# Patient Record
Sex: Female | Born: 1980 | Race: White | Hispanic: No | Marital: Single | Smoking: Never smoker
Health system: Southern US, Community
[De-identification: ages and names within clinical notes are randomized; demographics above are authoritative.]

## PROBLEM LIST (undated history)

## (undated) DIAGNOSIS — L409 Psoriasis, unspecified: Secondary | ICD-10-CM

## (undated) DIAGNOSIS — G43909 Migraine, unspecified, not intractable, without status migrainosus: Secondary | ICD-10-CM

## (undated) HISTORY — DX: Migraine, unspecified, not intractable, without status migrainosus: G43.909

## (undated) HISTORY — DX: Psoriasis, unspecified: L40.9

---

## 2008-08-27 HISTORY — PX: CHOLECYSTECTOMY: SHX55

## 2009-06-28 ENCOUNTER — Emergency Department: Payer: Self-pay | Admitting: Emergency Medicine

## 2009-07-20 ENCOUNTER — Ambulatory Visit: Payer: Self-pay | Admitting: Family Medicine

## 2009-08-12 ENCOUNTER — Ambulatory Visit: Payer: Self-pay | Admitting: Surgery

## 2009-08-17 ENCOUNTER — Ambulatory Visit: Payer: Self-pay | Admitting: Surgery

## 2013-05-19 ENCOUNTER — Ambulatory Visit: Payer: Self-pay

## 2014-11-09 ENCOUNTER — Ambulatory Visit: Payer: Self-pay | Admitting: Family Medicine

## 2014-11-22 ENCOUNTER — Ambulatory Visit
Admit: 2014-11-22 | Disposition: A | Discharge status: Home / Self Care | Payer: Self-pay | Attending: Family Medicine | Admitting: Family Medicine

## 2014-11-26 ENCOUNTER — Ambulatory Visit
Admit: 2014-11-26 | Disposition: A | Discharge status: Home / Self Care | Payer: Self-pay | Attending: Family Medicine | Admitting: Family Medicine

## 2015-04-13 ENCOUNTER — Ambulatory Visit: Payer: Self-pay | Admitting: Family Medicine

## 2015-04-20 ENCOUNTER — Ambulatory Visit: Payer: Self-pay | Admitting: Family Medicine

## 2015-09-02 ENCOUNTER — Ambulatory Visit: Payer: Self-pay | Admitting: Physician Assistant

## 2015-09-02 ENCOUNTER — Encounter: Payer: Self-pay | Admitting: Physician Assistant

## 2015-09-02 VITALS — BP 140/90 | HR 84 | Temp 98.0°F

## 2015-09-02 DIAGNOSIS — H6502 Acute serous otitis media, left ear: Secondary | ICD-10-CM

## 2015-09-02 MED ORDER — AMOXICILLIN 875 MG PO TABS: 875.0000 mg | ORAL_TABLET | Freq: Two times a day (BID) | ORAL | Status: DC

## 2015-09-02 NOTE — Progress Notes (Signed)
S: c/o left ear pain, pressure, no drainage or dif hearing, no fever/chills/cough/congestion  O: vitals wnl, nad, tm on left dull, red at superior, r tm wnl, nasal mucosa inflamed, throat wnl, neck supple no lymph lungs c t a, cv rrr  A: AOM  P: amoxil 875mg  ibd x 10d

## 2016-07-26 ENCOUNTER — Ambulatory Visit: Payer: Self-pay | Admitting: Family Medicine

## 2016-08-02 ENCOUNTER — Encounter: Payer: Self-pay | Admitting: Family Medicine

## 2016-08-02 ENCOUNTER — Ambulatory Visit (INDEPENDENT_AMBULATORY_CARE_PROVIDER_SITE_OTHER): Payer: BC Managed Care – PPO | Admitting: Family Medicine

## 2016-08-02 VITALS — BP 136/88 | HR 77 | Temp 98.0°F | Ht 63.2 in | Wt 274.6 lb

## 2016-08-02 DIAGNOSIS — Z114 Encounter for screening for human immunodeficiency virus [HIV]: Secondary | ICD-10-CM

## 2016-08-02 DIAGNOSIS — E1129 Type 2 diabetes mellitus with other diabetic kidney complication: Secondary | ICD-10-CM | POA: Insufficient documentation

## 2016-08-02 DIAGNOSIS — E119 Type 2 diabetes mellitus without complications: Secondary | ICD-10-CM | POA: Diagnosis not present

## 2016-08-02 DIAGNOSIS — E1165 Type 2 diabetes mellitus with hyperglycemia: Secondary | ICD-10-CM | POA: Insufficient documentation

## 2016-08-02 DIAGNOSIS — M25512 Pain in left shoulder: Secondary | ICD-10-CM | POA: Diagnosis not present

## 2016-08-02 LAB — MICROALBUMIN, URINE WAIVED
Creatinine, Urine Waived: 300 mg/dL (ref 10–300)
Microalb, Ur Waived: 30 mg/L — ABNORMAL HIGH (ref 0–19)
Microalb/Creat Ratio: <30 mg/g (ref <30)

## 2016-08-02 LAB — UA/M W/RFLX CULTURE, ROUTINE
Bilirubin, UA: NEGATIVE
GLUCOSE, UA: NEGATIVE
Ketones, UA: NEGATIVE
Leukocytes, UA: NEGATIVE
NITRITE UA: NEGATIVE
PH UA: 5.5 (ref 5.0–7.5)
Protein, UA: NEGATIVE
Specific Gravity, UA: 1.025 (ref 1.005–1.030)
UUROB: 0.2 mg/dL (ref 0.2–1.0)

## 2016-08-02 LAB — HEMOGLOBIN A1C: HEMOGLOBIN A1C: 7.2

## 2016-08-02 LAB — LIPID PANEL PICCOLO, WAIVED
CHOLESTEROL PICCOLO, WAIVED: 149 mg/dL (ref <200)
Chol/HDL Ratio Piccolo,Waive: 3.6 mg/dL
HDL CHOL PICCOLO, WAIVED: 42 mg/dL — AB (ref >59)
LDL Chol Calc Piccolo Waived: 89 mg/dL (ref <100)
TRIGLYCERIDES PICCOLO,WAIVED: 94 mg/dL (ref <150)
VLDL CHOL CALC PICCOLO,WAIVE: 19 mg/dL (ref <30)

## 2016-08-02 LAB — MICROSCOPIC EXAMINATION

## 2016-08-02 LAB — BAYER DCA HB A1C WAIVED: HB A1C: 7.2 % — AB (ref <7.0)

## 2016-08-02 MED ORDER — METFORMIN HCL 500 MG PO TABS: 500.0000 mg | ORAL_TABLET | Freq: Every day | ORAL | 3 refills | Status: DC

## 2016-08-02 NOTE — Progress Notes (Incomplete)
BP 136/88 (BP Location: Left Arm, Patient Position: Sitting, Cuff Size: Large)   Pulse 77   Temp 98 F (36.7 C)   Ht 5' 3.2" (1.605 m)   Wt 274 lb 9.6 oz (124.6 kg)   LMP 07/20/2016 (Approximate)   SpO2 97%   BMI 48.34 kg/m    Subjective:    Patient ID: Casimer LaniusJennifer R Ramdass, female    DOB: 13-Oct-1980, 35 y.o.   MRN: [id number]  HPI: Casimer LaniusJennifer R Lina is a 35 y.o. female  Chief Complaint  Patient presents with  . Diabetes  . Shoulder Pain   SHOULDER PAIN Duration: 6 months Involved shoulder: left Mechanism of injury: unknown Location: anterior Onset:gradual Severity: mild  Quality:  Dull ache Frequency: intermittent Radiation: no Aggravating factors: lifting and sleep  Alleviating factors: nothing  Status: fluctuating Treatments attempted: none  Relief with NSAIDs?:  No NSAIDs Taken Weakness: no Numbness: no Decreased grip strength: no Redness: no Swelling: no Bruising: no Fevers: no  DIABETES- diagnosed in March 2016 with an A1c of 6.5. Has been lost to follow up since that time. [facility].  Hypoglycemic episodes:no Polydipsia/polyuria: no Visual disturbance: no Chest pain: no Paresthesias: no Glucose Monitoring: no  Accucheck frequency: Not Checking Taking Insulin?: no Blood Pressure Monitoring: not checking Retinal Examination: Not up to Date Foot Exam: Up to Date Diabetic Education: Completed Pneumovax: Declined Influenza: Up to Date Aspirin: no  Relevant past medical, surgical, family and social history reviewed and updated as indicated. Interim medical history since our last visit reviewed. Allergies and medications reviewed and updated.  Review of Systems  Constitutional: Negative.   Respiratory: Negative.   Cardiovascular: Negative.   Musculoskeletal: Positive for arthralgias. Negative for back pain, gait problem, joint swelling, myalgias, neck pain and neck stiffness.    Psychiatric/Behavioral: Negative.     Per HPI unless specifically indicated above     Objective:    BP 136/88 (BP Location: Left Arm, Patient Position: Sitting, Cuff Size: Large)   Pulse 77   Temp 98 F (36.7 C)   Ht 5' 3.2" (1.605 m)   Wt 274 lb 9.6 oz (124.6 kg)   LMP 07/20/2016 (Approximate)   SpO2 97%   BMI 48.34 kg/m   Wt Readings from Last 3 Encounters:  08/02/16 274 lb 9.6 oz (124.6 kg)  11/03/14 283 lb (128.4 kg)    Physical Exam  Constitutional: She is oriented to person, place, and time. She appears well-developed and well-nourished. No distress.  HENT:  Head: Normocephalic and atraumatic.  Right Ear: Hearing normal.  Left Ear: Hearing normal.  Nose: Nose normal.  Eyes: Conjunctivae and lids are normal. Right eye exhibits no discharge. Left eye exhibits no discharge. No scleral icterus.  Cardiovascular: Normal rate, regular rhythm, normal heart sounds and intact distal pulses.  Exam reveals no gallop and no friction rub.   No murmur heard. Pulmonary/Chest: Effort normal and breath sounds normal. No respiratory distress. She has no wheezes. She has no rales. She exhibits no tenderness.  Musculoskeletal: Normal range of motion.  Neurological: She is alert and oriented to person, place, and time.  Skin: Skin is warm, dry and intact. No rash noted. No erythema. No pallor.  Psychiatric: She has a normal mood and affect. Her speech is normal and behavior is normal. Judgment and thought content normal. Cognition and memory are normal.  Nursing note and vitals reviewed.     Shoulder: left  Inspection:  no swelling, ecchymosis, erythema or step off deformity.   Tenderness to Palpation:    Acromion: no    AC joint:no    Clavicle: no    Bicipital groove: no    Scapular spine: no    Coracoid process: no    Humeral head: no    Supraspinatus tendon: no     Range of Motion:  Full Range of Motion bilaterally    Muscle Strength: 5/5 bilaterally     Neuro: Sensation  WNL. and Upper extremity reflexes WNL.     Special Tests:     Neer sign: Negative    Hawkins sign: Negative    Cross arm adduction: Negative    Yergason sign: Negative    O'brien sign: Negative     Speed sign: Negative   Diabetic Foot Exam - Simple   Simple Foot Form Diabetic Foot exam was performed with the following findings:  Yes 08/02/2016  9:25 AM  Visual Inspection No deformities, no ulcerations, no other skin breakdown bilaterally:  Yes Sensation Testing Intact to touch and monofilament testing bilaterally:  Yes Pulse Check Posterior Tibialis and Dorsalis pulse intact bilaterally:  Yes Comments    No results found for this or any previous visit.    Assessment & Plan:   Problem List Items Addressed This Visit      Endocrine   Diabetes mellitus type 2, diet-controlled (HCC) - Primary   Relevant Orders   Bayer DCA Hb A1c Waived   CBC with Differential/Platelet   Comprehensive metabolic panel   Lipid Panel Piccolo, Waived   Microalbumin, Urine Waived   TSH   UA/M w/rflx Culture, Routine    Other Visit Diagnoses    Screening for HIV (human immunodeficiency virus)       Labs checked today. Await results.    Relevant Orders   HIV antibody       Follow up plan: Return in about 3 months (around 10/31/2016) for Physical and DM visit.

## 2016-08-02 NOTE — Progress Notes (Signed)
BP 136/88 (BP Location: Left Arm, Patient Position: Sitting, Cuff Size: Large)   Pulse 77   Temp 98 F (36.7 C)   Ht 5' 3.2" (1.605 m)   Wt 274 lb 9.6 oz (124.6 kg)   LMP 07/20/2016 (Approximate)   SpO2 97%   BMI 48.34 kg/m    Subjective:    Patient ID: Mary Drake, female    DOB: November 04, 1980, 35 y.o.   MRN: [id number]  HPI: Mary LaniusJennifer R Devol is a 35 y.o. female  Chief Complaint  Patient presents with  . Diabetes  . Shoulder Pain  SHOULDER PAIN Duration: 6 months Involved shoulder: left Mechanism of injury: unknown Location: anterior Onset:gradual Severity: mild  Quality:  Dull ache Frequency: intermittent Radiation: no Aggravating factors: lifting and sleep  Alleviating factors: nothing  Status: fluctuating Treatments attempted: none  Relief with NSAIDs?:  No NSAIDs Taken Weakness: no Numbness: no Decreased grip strength: no Redness: no Swelling: no Bruising: no Fevers: no  DIABETES- diagnosed in March 2016 with an A1c of 6.5. Has been lost to follow up since that time. [facility].  Hypoglycemic episodes:no Polydipsia/polyuria: no Visual disturbance: no Chest pain: no Paresthesias: no Glucose Monitoring: no             Accucheck frequency: Not Checking Taking Insulin?: no Blood Pressure Monitoring: not checking Retinal Examination: Not up to Date Foot Exam: Up to Date Diabetic Education: Completed Pneumovax: Declined Influenza: Up to Date Aspirin: no   Relevant past medical, surgical, family and social history reviewed and updated as indicated. Interim medical history since our last visit reviewed. Allergies and medications reviewed and updated.  Review of Systems  Constitutional: Negative.   Respiratory: Negative.   Cardiovascular: Negative.   Musculoskeletal: Positive for arthralgias. Negative for back pain, gait problem, joint swelling, myalgias, neck pain and neck  stiffness.  Neurological: Negative.   Psychiatric/Behavioral: Negative.     Per HPI unless specifically indicated above     Objective:    BP 136/88 (BP Location: Left Arm, Patient Position: Sitting, Cuff Size: Large)   Pulse 77   Temp 98 F (36.7 C)   Ht 5' 3.2" (1.605 m)   Wt 274 lb 9.6 oz (124.6 kg)   LMP 07/20/2016 (Approximate)   SpO2 97%   BMI 48.34 kg/m   Wt Readings from Last 3 Encounters:  08/02/16 274 lb 9.6 oz (124.6 kg)  11/03/14 283 lb (128.4 kg)    Physical Exam  Constitutional: She is oriented to person, place, and time. She appears well-developed and well-nourished. No distress.  HENT:  Head: Normocephalic and atraumatic.  Right Ear: Hearing normal.  Left Ear: Hearing normal.  Nose: Nose normal.  Eyes: Conjunctivae and lids are normal. Right eye exhibits no discharge. Left eye exhibits no discharge. No scleral icterus.  Cardiovascular: Normal rate, regular rhythm, normal heart sounds and intact distal pulses.  Exam reveals no gallop and no friction rub.   No murmur heard. Pulmonary/Chest: Effort normal and breath sounds normal. No respiratory distress. She has no wheezes. She has no rales. She exhibits no tenderness.  Musculoskeletal: Normal range of motion.  Neurological: She is alert and oriented to person, place, and time.  Skin: Skin is warm, dry and intact. No rash noted. No erythema. No pallor.  Psychiatric: She has a normal mood and affect. Her speech is normal and behavior is normal. Judgment and thought content normal. Cognition and memory are normal.  Nursing note and vitals reviewed.    Shoulder: left    Inspection:  no swelling, ecchymosis, erythema or step off deformity.   Tenderness to Palpation:    Acromion: no    AC joint:no    Clavicle: no    Bicipital groove: no    Scapular spine: no    Coracoid process: no    Humeral head: no    Supraspinatus tendon: no     Range of Motion:  Full Range of Motion bilaterally    Muscle Strength:  5/5 bilaterally     Neuro: Sensation WNL. and Upper extremity reflexes WNL.     Special Tests:     Neer sign: Negative    Hawkins sign: Negative    Cross arm adduction: Negative    Yergason sign: Negative    O'brien sign: Negative     Speed sign: Negative  Diabetic Foot Exam - Simple   Simple Foot Form Diabetic Foot exam was performed with the following findings:  Yes 08/02/2016  9:25 AM  Visual Inspection No deformities, no ulcerations, no other skin breakdown bilaterally:  Yes Sensation Testing Intact to touch and monofilament testing bilaterally:  Yes Pulse Check Posterior Tibialis and Dorsalis pulse intact bilaterally:  Yes Comments      Results for orders placed or performed in visit on 08/02/16  Hemoglobin A1c  Result Value Ref Range   Hemoglobin A1C 7.2       Assessment & Plan:   Problem List Items Addressed This Visit      Endocrine   Diabetes mellitus type 2, uncomplicated (HCC) - Primary    A1c up to 7.2. Will start metformin daily. Recheck in 3 months.       Relevant Medications   metFORMIN (GLUCOPHAGE) 500 MG tablet    Other Visit Diagnoses    Screening for HIV (human immunodeficiency virus)       Labs checked today. Await results.    Relevant Orders   HIV antibody   Acute pain of left shoulder       OK to start [place]. Normal exam. Exercises given. Call with any concerns.        Follow up plan: Return in about 3 months (around 10/31/2016) for Physical and DM visit.

## 2016-08-02 NOTE — Assessment & Plan Note (Signed)
A1c up to 7.2. Will start metformin daily. Recheck in 3 months.

## 2016-08-03 ENCOUNTER — Encounter: Payer: Self-pay | Admitting: Family Medicine

## 2016-08-03 LAB — CBC WITH DIFFERENTIAL/PLATELET
BASOS ABS: 0.1 10*3/uL (ref 0.0–0.2)
Basos: 1 %
EOS (ABSOLUTE): 0.5 10*3/uL — AB (ref 0.0–0.4)
Eos: 6 %
Hematocrit: 39.3 % (ref 34.0–46.6)
Hemoglobin: 13 g/dL (ref 11.1–15.9)
IMMATURE GRANS (ABS): 0 10*3/uL (ref 0.0–0.1)
IMMATURE GRANULOCYTES: 0 %
LYMPHS: 30 %
Lymphocytes Absolute: 2.4 10*3/uL (ref 0.7–3.1)
MCH: 30 pg (ref 26.6–33.0)
MCHC: 33.1 g/dL (ref 31.5–35.7)
MCV: 91 fL (ref 79–97)
Monocytes Absolute: 0.7 10*3/uL (ref 0.1–0.9)
Monocytes: 9 %
NEUTROS PCT: 54 %
Neutrophils Absolute: 4.4 10*3/uL (ref 1.4–7.0)
PLATELETS: 352 10*3/uL (ref 150–379)
RBC: 4.33 x10E6/uL (ref 3.77–5.28)
RDW: 14.3 % (ref 12.3–15.4)
WBC: 8.1 10*3/uL (ref 3.4–10.8)

## 2016-08-03 LAB — COMPREHENSIVE METABOLIC PANEL
ALT: 28 IU/L (ref 0–32)
AST: 13 IU/L (ref 0–40)
Albumin/Globulin Ratio: 1.6 (ref 1.2–2.2)
Albumin: 4 g/dL (ref 3.5–5.5)
Alkaline Phosphatase: 49 IU/L (ref 39–117)
BILIRUBIN TOTAL: 0.4 mg/dL (ref 0.0–1.2)
BUN/Creatinine Ratio: 17 (ref 9–23)
BUN: 13 mg/dL (ref 6–20)
CALCIUM: 9 mg/dL (ref 8.7–10.2)
CHLORIDE: 99 mmol/L (ref 96–106)
CO2: 24 mmol/L (ref 18–29)
Creatinine, Ser: 0.76 mg/dL (ref 0.57–1.00)
GFR calc non Af Amer: 102 mL/min/{1.73_m2} (ref >59)
GFR, EST AFRICAN AMERICAN: 118 mL/min/{1.73_m2} (ref >59)
GLUCOSE: 138 mg/dL — AB (ref 65–99)
Globulin, Total: 2.5 g/dL (ref 1.5–4.5)
Potassium: 4.3 mmol/L (ref 3.5–5.2)
Sodium: 140 mmol/L (ref 134–144)
TOTAL PROTEIN: 6.5 g/dL (ref 6.0–8.5)

## 2016-08-03 LAB — HIV ANTIBODY (ROUTINE TESTING W REFLEX): HIV SCREEN 4TH GENERATION: NONREACTIVE

## 2016-08-03 LAB — TSH: TSH: 2.04 u[IU]/mL (ref 0.450–4.500)

## 2016-10-31 ENCOUNTER — Encounter: Payer: BC Managed Care – PPO | Admitting: Family Medicine

## 2016-11-29 ENCOUNTER — Encounter: Payer: Self-pay | Admitting: Family Medicine

## 2016-11-29 ENCOUNTER — Telehealth: Payer: Self-pay | Admitting: Family Medicine

## 2016-11-29 ENCOUNTER — Ambulatory Visit: Admission: RE | Admit: 2016-11-29 | Payer: BC Managed Care – PPO | Source: Ambulatory Visit

## 2016-11-29 ENCOUNTER — Ambulatory Visit (INDEPENDENT_AMBULATORY_CARE_PROVIDER_SITE_OTHER): Payer: BC Managed Care – PPO | Admitting: Family Medicine

## 2016-11-29 ENCOUNTER — Other Ambulatory Visit: Payer: Self-pay | Admitting: Family Medicine

## 2016-11-29 VITALS — BP 140/86 | HR 74 | Temp 97.8°F | Resp 17 | Ht 63.25 in | Wt 275.0 lb

## 2016-11-29 DIAGNOSIS — Z124 Encounter for screening for malignant neoplasm of cervix: Secondary | ICD-10-CM

## 2016-11-29 DIAGNOSIS — Z0000 Encounter for general adult medical examination without abnormal findings: Secondary | ICD-10-CM

## 2016-11-29 DIAGNOSIS — M79661 Pain in right lower leg: Secondary | ICD-10-CM | POA: Diagnosis not present

## 2016-11-29 DIAGNOSIS — M7989 Other specified soft tissue disorders: Secondary | ICD-10-CM | POA: Diagnosis not present

## 2016-11-29 DIAGNOSIS — E119 Type 2 diabetes mellitus without complications: Secondary | ICD-10-CM | POA: Diagnosis not present

## 2016-11-29 DIAGNOSIS — N898 Other specified noninflammatory disorders of vagina: Secondary | ICD-10-CM | POA: Diagnosis not present

## 2016-11-29 DIAGNOSIS — R252 Cramp and spasm: Secondary | ICD-10-CM

## 2016-11-29 DIAGNOSIS — R0683 Snoring: Secondary | ICD-10-CM | POA: Diagnosis not present

## 2016-11-29 DIAGNOSIS — J351 Hypertrophy of tonsils: Secondary | ICD-10-CM

## 2016-11-29 LAB — WET PREP FOR TRICH, YEAST, CLUE
CLUE CELL EXAM: NEGATIVE
TRICHOMONAS EXAM: NEGATIVE
YEAST EXAM: NEGATIVE

## 2016-11-29 LAB — BAYER DCA HB A1C WAIVED: HB A1C (BAYER DCA - WAIVED): 6.7 % (ref <7.0)

## 2016-11-29 NOTE — Addendum Note (Signed)
Addended by: Dorcas Carrow on: 11/29/2016 09:33 AM   Modules accepted: Orders

## 2016-11-29 NOTE — Addendum Note (Signed)
Addended by: Dorcas Carrow on: 11/29/2016 09:22 AM   Modules accepted: Orders

## 2016-11-29 NOTE — Assessment & Plan Note (Signed)
A1c down to 6.7- without taking metformin. Will stop metformin and recheck in 3 months. Continue diet and exercise. Call with any concerns.

## 2016-11-29 NOTE — Telephone Encounter (Signed)
Patient called back to get her appt for her ultrasound.  I gave her the information.  If someone needs to reach her please give her a call back.  thanks

## 2016-11-29 NOTE — Progress Notes (Signed)
BP 140/86 (BP Location: Left Arm, Patient Position: Sitting, Cuff Size: Normal)   Pulse 74   Temp 97.8 F (36.6 C) (Oral)   Resp 17   Ht 5' 3.25" (1.607 m)   Wt 275 lb (124.7 kg)   LMP 11/05/2016   SpO2 97%   BMI 48.33 kg/m    Subjective:    Patient ID: Mary Drake, female    DOB: 08-03-1981, 36 y.o.   MRN: [id number]  HPI: KARIMA CARRELL is a 36 y.o. female presenting on 11/29/2016 for comprehensive medical examination. Current medical complaints include:  DIABETES- has not been taking her metformin Hypoglycemic episodes:no Polydipsia/polyuria: no Visual disturbance: no Chest pain: no Paresthesias: no Glucose Monitoring: no Taking Insulin?: no Blood Pressure Monitoring: not checking Retinal Examination: Not up to Date Foot Exam: Up to Date Diabetic Education: Completed Pneumovax: Declined Influenza: Up to Date Aspirin: no  KNEE PAIN Duration: 3+ months Involved knee: right Mechanism of injury: unknown Location: in her shin and and her ankle Onset: gradual Severity: 3-4/10 up to a 8-9/10  Quality:  dull, burning, shooting and stabbing, cramping, pressure Frequency: constant Radiation: yes- into her ankle Aggravating factors: weight bearing, sitting on it  Alleviating factors: rest  Status: worse Treatments attempted: rest, heat and aleve  Relief with NSAIDs?:  mild Weakness with weight bearing or walking: no Sensation of giving way: yes Locking: no Popping: no Bruising: no Swelling: yes Redness: no Paresthesias/decreased sensation: no Fevers: no  Menopausal Symptoms: no  Depression Screen done today and results listed below:  Depression screen [facility] 2/9 11/29/2016  Decreased Interest 0  Down, Depressed, Hopeless 0  PHQ - 2 Score 0    Past Medical History:  Past Medical History:  Diagnosis Date  . Migraines   . Psoriasis     Surgical History:  Past Surgical History:  Procedure Laterality Date  . CHOLECYSTECTOMY  2010     Medications:  Current Outpatient Prescriptions on File Prior to Visit  Medication Sig  . desonide (DESOWEN) 0.05 % cream Apply topically.  . folic acid (FOLVITE) 800 MCG tablet Take 400 mcg by mouth daily.  . methotrexate (RHEUMATREX) 2.5 MG tablet Take 2.5 mg by mouth 3 (three) times a week. 6 tablets weekly   No current facility-administered medications on file prior to visit.     Allergies:  Allergies  Allergen Reactions  . Codeine     Social History:  Social History   Social History  . Marital status: Single    Spouse name: N/A  . Number of children: N/A  . Years of education: N/A   Occupational History  . Not on file.   Social History Main Topics  . Smoking status: Never Smoker  . Smokeless tobacco: Never Used  . Alcohol use Yes     Comment: 0n occasion  . Drug use: No  . Sexual activity: Not on file   Other Topics Concern  . Not on file   Social History Narrative  . No narrative on file   History  Smoking Status  . Never Smoker  Smokeless Tobacco  . Never Used   History  Alcohol Use  . Yes    Comment: 0n occasion    Family History:  Family History  Problem Relation Age of Onset  . Diabetes Mother   . Hypertension Mother   . Diabetes Father     Past medical history, surgical history, medications, allergies, family history and social history reviewed with patient today and changes  made to appropriate areas of the chart.   Review of Systems  Constitutional: Negative.   HENT: Positive for tinnitus. Negative for congestion, ear discharge, ear pain, hearing loss, nosebleeds, sinus pain and sore throat.   Eyes: Negative.   Respiratory: Negative.  Negative for stridor.   Cardiovascular: Negative.   Gastrointestinal: Negative.   Genitourinary: Negative.   Musculoskeletal: Positive for joint pain and myalgias. Negative for back pain, falls and neck pain.  Skin: Negative.   Neurological: Negative.   Endo/Heme/Allergies: Positive for  environmental allergies. Negative for polydipsia. Does not bruise/bleed easily.  Psychiatric/Behavioral: Negative.     All other ROS negative except what is listed above and in the HPI.      Objective:    BP 140/86 (BP Location: Left Arm, Patient Position: Sitting, Cuff Size: Normal)   Pulse 74   Temp 97.8 F (36.6 C) (Oral)   Resp 17   Ht 5' 3.25" (1.607 m)   Wt 275 lb (124.7 kg)   LMP 11/05/2016   SpO2 97%   BMI 48.33 kg/m   Wt Readings from Last 3 Encounters:  11/29/16 275 lb (124.7 kg)  08/02/16 274 lb 9.6 oz (124.6 kg)  11/03/14 283 lb (128.4 kg)    Physical Exam  Constitutional: She is oriented to person, place, and time. She appears well-developed and well-nourished. No distress.  HENT:  Head: Normocephalic and atraumatic.  Right Ear: Hearing, external ear and ear canal normal. A middle ear effusion is present.  Left Ear: Hearing, external ear and ear canal normal. A middle ear effusion is present.  Nose: Mucosal edema and rhinorrhea present. Right sinus exhibits no maxillary sinus tenderness and no frontal sinus tenderness. Left sinus exhibits no maxillary sinus tenderness and no frontal sinus tenderness.  Mouth/Throat: Uvula is midline and mucous membranes are normal. Posterior oropharyngeal edema present.  3+ tonsils bilaterally  Eyes: Conjunctivae and lids are normal. Pupils are equal, round, and reactive to light. Right eye exhibits no discharge. Left eye exhibits no discharge. No scleral icterus.  Neck: Normal range of motion. Neck supple. No JVD present. No tracheal deviation present. No thyromegaly present.  Cardiovascular: Normal rate, regular rhythm, normal heart sounds and intact distal pulses.  Exam reveals no gallop and no friction rub.   No murmur heard. Pulmonary/Chest: Effort normal and breath sounds normal. No stridor. No respiratory distress. She has no wheezes. She has no rales. She exhibits no tenderness.  Abdominal: Soft. Bowel sounds are normal. She  exhibits no distension and no mass. There is no tenderness. There is no rebound and no guarding. Hernia confirmed negative in the right inguinal area and confirmed negative in the left inguinal area.  Genitourinary: Uterus normal. No labial fusion. There is no rash, tenderness, lesion or injury on the right labia. There is no rash, tenderness, lesion or injury on the left labia. Uterus is not deviated, not enlarged, not fixed and not tender. Cervix exhibits discharge. Cervix exhibits no motion tenderness and no friability. Right adnexum displays no mass, no tenderness and no fullness. Left adnexum displays no mass, no tenderness and no fullness. No erythema, tenderness or bleeding in the vagina. No foreign body in the vagina. No signs of injury around the vagina. Vaginal discharge found.  Musculoskeletal: Normal range of motion. She exhibits edema and tenderness. She exhibits no deformity.  R shin significantly larger than the L. +Squeeze, negative Homan's no redness, no heat  Lymphadenopathy:    She has no cervical adenopathy.  Neurological: She  is alert and oriented to person, place, and time. She has normal reflexes. She displays normal reflexes. No cranial nerve deficit. She exhibits normal muscle tone. Coordination normal.  Skin: Skin is warm, dry and intact. Rash noted. She is not diaphoretic. No erythema. No pallor.  Plaque psoriais under her breasts bilaterally  Psychiatric: She has a normal mood and affect. Her speech is normal and behavior is normal. Judgment and thought content normal. Cognition and memory are normal.    Results for orders placed or performed in visit on 08/02/16  Microscopic Examination  Result Value Ref Range   WBC, UA 0-5 0 - 5 /hpf   RBC, UA 0-2 0 - 2 /hpf   Epithelial Cells (non renal) 0-10 0 - 10 /hpf   Bacteria, UA Few (A) None seen/Few  Bayer DCA Hb A1c Waived  Result Value Ref Range   Bayer DCA Hb A1c Waived 7.2 (H) <7.0 %  CBC with Differential/Platelet   Result Value Ref Range   WBC 8.1 3.4 - 10.8 x10E3/uL   RBC 4.33 3.77 - 5.28 x10E6/uL   Hemoglobin 13.0 11.1 - 15.9 g/dL   Hematocrit 81.1 91.4 - 46.6 %   MCV 91 79 - 97 fL   MCH 30.0 26.6 - 33.0 pg   MCHC 33.1 31.5 - 35.7 g/dL   RDW 78.2 95.6 - 21.3 %   Platelets 352 150 - 379 x10E3/uL   Neutrophils 54 Not Estab. %   Lymphs 30 Not Estab. %   Monocytes 9 Not Estab. %   Eos 6 Not Estab. %   Basos 1 Not Estab. %   Neutrophils Absolute 4.4 1.4 - 7.0 x10E3/uL   Lymphocytes Absolute 2.4 0.7 - 3.1 x10E3/uL   Monocytes Absolute 0.7 0.1 - 0.9 x10E3/uL   EOS (ABSOLUTE) 0.5 (H) 0.0 - 0.4 x10E3/uL   Basophils Absolute 0.1 0.0 - 0.2 x10E3/uL   Immature Granulocytes 0 Not Estab. %   Immature Grans (Abs) 0.0 0.0 - 0.1 x10E3/uL  Comprehensive metabolic panel  Result Value Ref Range   Glucose 138 (H) 65 - 99 mg/dL   BUN 13 6 - 20 mg/dL   Creatinine, Ser 0.86 0.57 - 1.00 mg/dL   GFR calc non Af Amer 102 >59 mL/min/1.73   GFR calc Af Amer 118 >59 mL/min/1.73   BUN/Creatinine Ratio 17 9 - 23   Sodium 140 134 - 144 mmol/L   Potassium 4.3 3.5 - 5.2 mmol/L   Chloride 99 96 - 106 mmol/L   CO2 24 18 - 29 mmol/L   Calcium 9.0 8.7 - 10.2 mg/dL   Total Protein 6.5 6.0 - 8.5 g/dL   Albumin 4.0 3.5 - 5.5 g/dL   Globulin, Total 2.5 1.5 - 4.5 g/dL   Albumin/Globulin Ratio 1.6 1.2 - 2.2   Bilirubin Total 0.4 0.0 - 1.2 mg/dL   Alkaline Phosphatase 49 39 - 117 IU/L   AST 13 0 - 40 IU/L   ALT 28 0 - 32 IU/L  Lipid Panel Piccolo, Waived  Result Value Ref Range   Cholesterol Piccolo, Waived 149 <200 mg/dL   HDL Chol Piccolo, Waived 42 (L) >59 mg/dL   Triglycerides Piccolo,Waived 94 <150 mg/dL   Chol/HDL Ratio Piccolo,Waive 3.6 mg/dL   LDL Chol Calc Piccolo Waived 89 <100 mg/dL   VLDL Chol Calc Piccolo,Waive 19 <30 mg/dL  Microalbumin, Urine Waived  Result Value Ref Range   Microalb, Ur Waived 30 (H) 0 - 19 mg/L   Creatinine, Urine Waived 300 10 -  300 mg/dL   Microalb/Creat Ratio <30 <30 mg/g  TSH   Result Value Ref Range   TSH 2.040 0.450 - 4.500 uIU/mL  UA/M w/rflx Culture, Routine  Result Value Ref Range   Specific Gravity, UA 1.025 1.005 - 1.030   pH, UA 5.5 5.0 - 7.5   Color, UA Yellow Yellow   Appearance Ur Clear Clear   Leukocytes, UA Negative Negative   Protein, UA Negative Negative/Trace   Glucose, UA Negative Negative   Ketones, UA Negative Negative   RBC, UA Trace (A) Negative   Bilirubin, UA Negative Negative   Urobilinogen, Ur 0.2 0.2 - 1.0 mg/dL   Nitrite, UA Negative Negative   Microscopic Examination See below:   HIV antibody  Result Value Ref Range   HIV Screen 4th Generation wRfx Non Reactive Non Reactive  Hemoglobin A1c  Result Value Ref Range   Hemoglobin A1C 7.2       Assessment & Plan:   Problem List Items Addressed This Visit      Endocrine   Diabetes mellitus type 2, uncomplicated (HCC)    A1c down to 6.7- without taking metformin. Will stop metformin and recheck in 3 months. Continue diet and exercise. Call with any concerns.       Relevant Orders   Bayer DCA Hb A1c Waived   Comprehensive metabolic panel   Magnesium   Phosphorus   Ferritin   CBC with Differential/Platelet    Other Visit Diagnoses    Routine general medical examination at a health care facility    -  Primary   Up to date on vaccines, Screening labs checked last visit. Pap done today. Continue diet and exercise. Call with any concerns.    Leg cramps       Checking labs and RLE [place]- Await results. Rest and stretches. Call with any concerns.    Relevant Orders   Comprehensive metabolic panel   Magnesium   Phosphorus   Ferritin   CBC with Differential/Platelet   Ambulatory referral to Vascular Surgery   Vaginal discharge       Will check wet prep. Negative. Continue to monitor. Call with any changes.    Relevant Orders   WET PREP FOR TRICH, YEAST, CLUE   Pain and swelling of right lower leg       WIll check labs. Will obtain R LE [place] to look for clot, Await results.     Relevant Orders   Ambulatory referral to Vascular Surgery   Snoring       Concern for OSA- will get into ENT for evaluation of tonsils and get sleep study   Relevant Orders   Ambulatory referral to ENT   Ambulatory referral to Sleep Studies   Enlarged tonsils       Concern for OSA- will get into ENT and obtain Sleep study   Relevant Orders   Ambulatory referral to ENT   Screening for cervical cancer       Pap done today. Await results.    Relevant Orders   IGP, Aptima HPV, rfx 16/18,45       Follow up plan: Return in about 3 months (around 02/28/2017) for DM visit.   LABORATORY TESTING:  - Pap smear: pap done  IMMUNIZATIONS:   - Tdap: Tetanus vaccination status reviewed: last tetanus booster within 10 years. - Influenza: Up to date - Pneumovax: Refused  PATIENT COUNSELING:   Advised to take 1 mg of folate supplement per day if capable of pregnancy.  Sexuality: Discussed sexually transmitted diseases, partner selection, use of condoms, avoidance of unintended pregnancy  and contraceptive alternatives.   Advised to avoid cigarette smoking.  I discussed with the patient that most people either abstain from alcohol or drink within safe limits (<=14/week and <=4 drinks/occasion for males, <=7/weeks and <= 3 drinks/occasion for females) and that the risk for alcohol disorders and other health effects rises proportionally with the number of drinks per week and how often a drinker exceeds daily limits.  Discussed cessation/primary prevention of drug use and availability of treatment for abuse.   Diet: Encouraged to adjust caloric intake to maintain  or achieve ideal body weight, to reduce intake of dietary saturated fat and total fat, to limit sodium intake by avoiding high sodium foods and not adding table salt, and to maintain adequate dietary potassium and calcium preferably from fresh fruits, vegetables, and low-fat dairy products.    stressed the importance of regular  exercise  Injury prevention: Discussed safety belts, safety helmets, smoke detector, smoking near bedding or upholstery.   Dental health: Discussed importance of regular tooth brushing, flossing, and dental visits.    NEXT PREVENTATIVE PHYSICAL DUE IN 1 YEAR. Return in about 3 months (around 02/28/2017) for DM visit.

## 2016-11-29 NOTE — Addendum Note (Signed)
Addended by: Dorcas Carrow on: 11/29/2016 09:30 AM   Modules accepted: Orders

## 2016-11-29 NOTE — Patient Instructions (Addendum)
Health Maintenance, Female Adopting a healthy lifestyle and getting preventive care can go a long way to promote health and wellness. Talk with your health care provider about what schedule of regular examinations is right for you. This is a good chance for you to check in with your provider about disease prevention and staying healthy. In between checkups, there are plenty of things you can do on your own. Experts have done a lot of research about which lifestyle changes and preventive measures are most likely to keep you healthy. Ask your health care provider for more information. Weight and diet Eat a healthy diet  Be sure to include plenty of vegetables, fruits, low-fat dairy products, and lean protein.  Do not eat a lot of foods high in solid fats, added sugars, or salt.  Get regular exercise. This is one of the most important things you can do for your health.  Most adults should exercise for at least 150 minutes each week. The exercise should increase your heart rate and make you sweat (moderate-intensity exercise).  Most adults should also do strengthening exercises at least twice a week. This is in addition to the moderate-intensity exercise. Maintain a healthy weight  Body mass index (BMI) is a measurement that can be used to identify possible weight problems. It estimates body fat based on height and weight. Your health care provider can help determine your BMI and help you achieve or maintain a healthy weight.  For females 76 years of age and older:  A BMI below 18.5 is considered underweight.  A BMI of 18.5 to 24.9 is normal.  A BMI of 25 to 29.9 is considered overweight.  A BMI of 30 and above is considered obese. Watch levels of cholesterol and blood lipids  You should start having your blood tested for lipids and cholesterol at 36 years of age, then have this test every 5 years.  You may need to have your cholesterol levels checked more often if:  Your lipid or  cholesterol levels are high.  You are older than 36 years of age.  You are at high risk for heart disease. Cancer screening Lung Cancer  Lung cancer screening is recommended for adults 64-42 years old who are at high risk for lung cancer because of a history of smoking.  A yearly low-dose CT scan of the lungs is recommended for people who:  Currently smoke.  Have quit within the past 15 years.  Have at least a 30-pack-year history of smoking. A pack year is smoking an average of one pack of cigarettes a day for 1 year.  Yearly screening should continue until it has been 15 years since you quit.  Yearly screening should stop if you develop a health problem that would prevent you from having lung cancer treatment. Breast Cancer  Practice breast self-awareness. This means understanding how your breasts normally appear and feel.  It also means doing regular breast self-exams. Let your health care provider know about any changes, no matter how small.  If you are in your 20s or 30s, you should have a clinical breast exam (CBE) by a health care provider every 1-3 years as part of a regular health exam.  If you are 34 or older, have a CBE every year. Also consider having a breast X-ray (mammogram) every year.  If you have a family history of breast cancer, talk to your health care provider about genetic screening.  If you are at high risk for breast cancer, talk  to your health care provider about having an MRI and a mammogram every year.  Breast cancer gene (BRCA) assessment is recommended for women who have family members with BRCA-related cancers. BRCA-related cancers include:  Breast.  Ovarian.  Tubal.  Peritoneal cancers.  Results of the assessment will determine the need for genetic counseling and BRCA1 and BRCA2 testing. Cervical Cancer  Your health care provider may recommend that you be screened regularly for cancer of the pelvic organs (ovaries, uterus, and vagina).  This screening involves a pelvic examination, including checking for microscopic changes to the surface of your cervix (Pap test). You may be encouraged to have this screening done every 3 years, beginning at age 24.  For women ages 66-65, health care providers may recommend pelvic exams and Pap testing every 3 years, or they may recommend the Pap and pelvic exam, combined with testing for human papilloma virus (HPV), every 5 years. Some types of HPV increase your risk of cervical cancer. Testing for HPV may also be done on women of any age with unclear Pap test results.  Other health care providers may not recommend any screening for nonpregnant women who are considered low risk for pelvic cancer and who do not have symptoms. Ask your health care provider if a screening pelvic exam is right for you.  If you have had past treatment for cervical cancer or a condition that could lead to cancer, you need Pap tests and screening for cancer for at least 20 years after your treatment. If Pap tests have been discontinued, your risk factors (such as having a new sexual partner) need to be reassessed to determine if screening should resume. Some women have medical problems that increase the chance of getting cervical cancer. In these cases, your health care provider may recommend more frequent screening and Pap tests. Colorectal Cancer  This type of cancer can be detected and often prevented.  Routine colorectal cancer screening usually begins at 36 years of age and continues through 36 years of age.  Your health care provider may recommend screening at an earlier age if you have risk factors for colon cancer.  Your health care provider may also recommend using home test kits to check for hidden blood in the stool.  A small camera at the end of a tube can be used to examine your colon directly (sigmoidoscopy or colonoscopy). This is done to check for the earliest forms of colorectal cancer.  Routine  screening usually begins at age 41.  Direct examination of the colon should be repeated every 5-10 years through 35 years of age. However, you may need to be screened more often if early forms of precancerous polyps or small growths are found. Skin Cancer  Check your skin from head to toe regularly.  Tell your health care provider about any new moles or changes in moles, especially if there is a change in a mole's shape or color.  Also tell your health care provider if you have a mole that is larger than the size of a pencil eraser.  Always use sunscreen. Apply sunscreen liberally and repeatedly throughout the day.  Protect yourself by wearing long sleeves, pants, a wide-brimmed hat, and sunglasses whenever you are outside. Heart disease, diabetes, and high blood pressure  High blood pressure causes heart disease and increases the risk of stroke. High blood pressure is more likely to develop in:  People who have blood pressure in the high end of the normal range (130-139/85-89 mm Hg).  People who are overweight or obese.  People who are African American.  If you are 59-24 years of age, have your blood pressure checked every 3-5 years. If you are 34 years of age or older, have your blood pressure checked every year. You should have your blood pressure measured twice-once when you are at a hospital or clinic, and once when you are not at a hospital or clinic. Record the average of the two measurements. To check your blood pressure when you are not at a hospital or clinic, you can use:  An automated blood pressure machine at a pharmacy.  A home blood pressure monitor.  If you are between 29 years and 60 years old, ask your health care provider if you should take aspirin to prevent strokes.  Have regular diabetes screenings. This involves taking a blood sample to check your fasting blood sugar level.  If you are at a normal weight and have a low risk for diabetes, have this test once  every three years after 36 years of age.  If you are overweight and have a high risk for diabetes, consider being tested at a younger age or more often. Preventing infection Hepatitis B  If you have a higher risk for hepatitis B, you should be screened for this virus. You are considered at high risk for hepatitis B if:  You were born in a country where hepatitis B is common. Ask your health care provider which countries are considered high risk.  Your parents were born in a high-risk country, and you have not been immunized against hepatitis B (hepatitis B vaccine).  You have HIV or AIDS.  You use needles to inject street drugs.  You live with someone who has hepatitis B.  You have had sex with someone who has hepatitis B.  You get hemodialysis treatment.  You take certain medicines for conditions, including cancer, organ transplantation, and autoimmune conditions. Hepatitis C  Blood testing is recommended for:  Everyone born from [birth year] through 1965.  Anyone with known risk factors for hepatitis C. Sexually transmitted infections (STIs)  You should be screened for sexually transmitted infections (STIs) including gonorrhea and chlamydia if:  You are sexually active and are younger than 36 years of age.  You are older than 36 years of age and your health care provider tells you that you are at risk for this type of infection.  Your sexual activity has changed since you were last screened and you are at an increased risk for chlamydia or gonorrhea. Ask your health care provider if you are at risk.  If you do not have HIV, but are at risk, it may be recommended that you take a prescription medicine daily to prevent HIV infection. This is called pre-exposure prophylaxis (PrEP). You are considered at risk if:  You are sexually active and do not regularly use condoms or know the HIV status of your partner(s).  You take drugs by injection.  You are sexually active with a partner  who has HIV. Talk with your health care provider about whether you are at high risk of being infected with HIV. If you choose to begin PrEP, you should first be tested for HIV. You should then be tested every 3 months for as long as you are taking PrEP. Pregnancy  If you are premenopausal and you may become pregnant, ask your health care provider about preconception counseling.  If you may become pregnant, take 400 to 800 micrograms (mcg) of folic acid  every day.  If you want to prevent pregnancy, talk to your health care provider about birth control (contraception). Osteoporosis and menopause  Osteoporosis is a disease in which the bones lose minerals and strength with aging. This can result in serious bone fractures. Your risk for osteoporosis can be identified using a bone density scan.  If you are 74 years of age or older, or if you are at risk for osteoporosis and fractures, ask your health care provider if you should be screened.  Ask your health care provider whether you should take a calcium or vitamin D supplement to lower your risk for osteoporosis.  Menopause may have certain physical symptoms and risks.  Hormone replacement therapy may reduce some of these symptoms and risks. Talk to your health care provider about whether hormone replacement therapy is right for you. Follow these instructions at home:  Schedule regular health, dental, and eye exams.  Stay current with your immunizations.  Do not use any tobacco products including cigarettes, chewing tobacco, or electronic cigarettes.  If you are pregnant, do not drink alcohol.  If you are breastfeeding, limit how much and how often you drink alcohol.  Limit alcohol intake to no more than 1 drink per day for nonpregnant women. One drink equals 12 ounces of beer, 5 ounces of wine, or 1 ounces of hard liquor.  Do not use street drugs.  Do not share needles.  Ask your health care provider for help if you need support  or information about quitting drugs.  Tell your health care provider if you often feel depressed.  Tell your health care provider if you have ever been abused or do not feel safe at home. This information is not intended to replace advice given to you by your health care provider. Make sure you discuss any questions you have with your health care provider. Document Released: 02/26/2011 Document Revised: 01/19/2016 Document Reviewed: 05/17/2015 Elsevier Interactive Patient Education  2017 [employer] Leg Cramps Leg cramps occur when a muscle or muscles tighten and you have no control over this tightening (involuntary muscle contraction). Muscle cramps can develop in any muscle, but the most common place is in the calf muscles of the leg. Those cramps can occur during exercise or when you are at rest. Leg cramps are painful, and they may last for a few seconds to a few minutes. Cramps may return several times before they finally stop. Usually, leg cramps are not caused by a serious medical problem. In many cases, the cause is not known. Some common causes include:  Overexertion.  Overuse from repetitive motions, or doing the same thing over and over.  Remaining in a certain position for a long period of time.  Improper preparation, form, or technique while performing a sport or an activity.  Dehydration.  Injury.  Side effects of some medicines.  Abnormally low levels of the salts and ions in your blood (electrolytes), especially potassium and calcium. These levels could be low if you are taking water pills (diuretics) or if you are pregnant. Follow these instructions at home: Watch your condition for any changes. Taking the following actions may help to lessen any discomfort that you are feeling:  Stay well-hydrated. Drink enough fluid to keep your urine clear or pale yellow.  Try massaging, stretching, and relaxing the affected muscle. Do this for several minutes at a time.  For  tight or tense muscles, use a warm towel, heating pad, or hot shower water directed to the affected  area.  If you are sore or have pain after a cramp, applying ice to the affected area may relieve discomfort.  Put ice in a plastic bag.  Place a towel between your skin and the bag.  Leave the ice on for 20 minutes, 2-3 times per day.  Avoid strenuous exercise for several days if you have been having frequent leg cramps.  Make sure that your diet includes the essential minerals for your muscles to work normally.  Take medicines only as directed by your health care provider. Contact a health care provider if:  Your leg cramps get more severe or more frequent, or they do not improve over time.  Your foot becomes cold, numb, or blue. This information is not intended to replace advice given to you by your health care provider. Make sure you discuss any questions you have with your health care provider. Document Released: 09/20/2004 Document Revised: 01/19/2016 Document Reviewed: 07/21/2014 Elsevier Interactive Patient Education  2017 [employer].

## 2016-11-29 NOTE — Telephone Encounter (Signed)
Called to let Graclynn know that her wet prep was negative. LMOM.

## 2016-11-30 ENCOUNTER — Telehealth: Payer: Self-pay | Admitting: Family Medicine

## 2016-11-30 ENCOUNTER — Ambulatory Visit
Admission: RE | Admit: 2016-11-30 | Discharge: 2016-11-30 | Disposition: A | Discharge status: Home / Self Care | Payer: BC Managed Care – PPO | Source: Ambulatory Visit | Attending: Family Medicine | Admitting: Family Medicine

## 2016-11-30 DIAGNOSIS — M79661 Pain in right lower leg: Secondary | ICD-10-CM | POA: Insufficient documentation

## 2016-11-30 DIAGNOSIS — M7989 Other specified soft tissue disorders: Secondary | ICD-10-CM | POA: Insufficient documentation

## 2016-11-30 DIAGNOSIS — R252 Cramp and spasm: Secondary | ICD-10-CM | POA: Diagnosis not present

## 2016-11-30 LAB — CBC WITH DIFFERENTIAL/PLATELET
BASOS ABS: 0.1 10*3/uL (ref 0.0–0.2)
Basos: 1 %
EOS (ABSOLUTE): 0.4 10*3/uL (ref 0.0–0.4)
Eos: 5 %
HEMOGLOBIN: 13.9 g/dL (ref 11.1–15.9)
Hematocrit: 42 % (ref 34.0–46.6)
IMMATURE GRANS (ABS): 0 10*3/uL (ref 0.0–0.1)
IMMATURE GRANULOCYTES: 0 %
LYMPHS: 25 %
Lymphocytes Absolute: 2.1 10*3/uL (ref 0.7–3.1)
MCH: 30.2 pg (ref 26.6–33.0)
MCHC: 33.1 g/dL (ref 31.5–35.7)
MCV: 91 fL (ref 79–97)
MONOCYTES: 9 %
Monocytes Absolute: 0.7 10*3/uL (ref 0.1–0.9)
NEUTROS PCT: 60 %
Neutrophils Absolute: 4.9 10*3/uL (ref 1.4–7.0)
PLATELETS: 371 10*3/uL (ref 150–379)
RBC: 4.6 x10E6/uL (ref 3.77–5.28)
RDW: 14.7 % (ref 12.3–15.4)
WBC: 8.1 10*3/uL (ref 3.4–10.8)

## 2016-11-30 LAB — COMPREHENSIVE METABOLIC PANEL
ALT: 22 IU/L (ref 0–32)
AST: 12 IU/L (ref 0–40)
Albumin/Globulin Ratio: 1.4 (ref 1.2–2.2)
Albumin: 4.2 g/dL (ref 3.5–5.5)
Alkaline Phosphatase: 52 IU/L (ref 39–117)
BUN/Creatinine Ratio: 23 (ref 9–23)
BUN: 16 mg/dL (ref 6–20)
Bilirubin Total: 0.3 mg/dL (ref 0.0–1.2)
CO2: 23 mmol/L (ref 18–29)
CREATININE: 0.71 mg/dL (ref 0.57–1.00)
Calcium: 9.3 mg/dL (ref 8.7–10.2)
Chloride: 101 mmol/L (ref 96–106)
GFR calc Af Amer: 128 mL/min/{1.73_m2} (ref >59)
GFR, EST NON AFRICAN AMERICAN: 111 mL/min/{1.73_m2} (ref >59)
GLUCOSE: 155 mg/dL — AB (ref 65–99)
Globulin, Total: 2.9 g/dL (ref 1.5–4.5)
Potassium: 4.2 mmol/L (ref 3.5–5.2)
Sodium: 140 mmol/L (ref 134–144)
Total Protein: 7.1 g/dL (ref 6.0–8.5)

## 2016-11-30 LAB — MAGNESIUM: Magnesium: 1.8 mg/dL (ref 1.6–2.3)

## 2016-11-30 LAB — PHOSPHORUS: PHOSPHORUS: 4.3 mg/dL (ref 2.5–4.5)

## 2016-11-30 LAB — FERRITIN: FERRITIN: 53 ng/mL (ref 15–150)

## 2016-11-30 NOTE — Telephone Encounter (Signed)
Mary Drake with her results. Everything was normal. All questions answered.

## 2016-12-02 LAB — IGP, APTIMA HPV, RFX 16/18,45
HPV Aptima: NEGATIVE
PAP SMEAR COMMENT: 0

## 2016-12-03 ENCOUNTER — Telehealth: Payer: Self-pay | Admitting: Family Medicine

## 2016-12-03 DIAGNOSIS — R87612 Low grade squamous intraepithelial lesion on cytologic smear of cervix (LGSIL): Secondary | ICD-10-CM | POA: Insufficient documentation

## 2016-12-03 NOTE — Telephone Encounter (Signed)
Called patient to give her results of pap. LMOM. Pap came back abnormal but HPV came back normal. Needs repeat pap in 1 year, but nothing to worry about now. OK to give her this message if she calls.

## 2016-12-03 NOTE — Telephone Encounter (Signed)
Called and relayed message

## 2016-12-03 NOTE — Telephone Encounter (Signed)
Patient returned your call. She can be reached at [facility] [phone]  Thanks

## 2017-01-17 ENCOUNTER — Encounter (INDEPENDENT_AMBULATORY_CARE_PROVIDER_SITE_OTHER): Payer: BC Managed Care – PPO

## 2017-03-12 ENCOUNTER — Ambulatory Visit (INDEPENDENT_AMBULATORY_CARE_PROVIDER_SITE_OTHER): Payer: BC Managed Care – PPO | Admitting: Family Medicine

## 2017-03-12 ENCOUNTER — Encounter: Payer: Self-pay | Admitting: Family Medicine

## 2017-03-12 VITALS — BP 113/79 | HR 76 | Temp 98.3°F | Wt 279.3 lb

## 2017-03-12 DIAGNOSIS — E119 Type 2 diabetes mellitus without complications: Secondary | ICD-10-CM

## 2017-03-12 NOTE — Assessment & Plan Note (Signed)
A1c stable at 6.7 off meds. Continue diet and exercise. Recheck 6 months. Call with any concerns.

## 2017-03-12 NOTE — Progress Notes (Signed)
BP 113/79 (BP Location: Left Arm, Patient Position: Sitting, Cuff Size: Large)   Pulse 76   Temp 98.3 F (36.8 C)   Wt 279 lb 5 oz (126.7 kg)   LMP 02/08/2017 (Approximate)   SpO2 96%   BMI 49.09 kg/m    Subjective:    Patient ID: Mary Drake, female    DOB: 07-01-1981, 36 y.o.   MRN: [id number]  HPI: Mary Drake is a 36 y.o. female  Chief Complaint  Patient presents with  . Diabetes   DIABETES Hypoglycemic episodes:no Polydipsia/polyuria: no Visual disturbance: no Chest pain: no Paresthesias: no Glucose Monitoring: no Taking Insulin?: no Blood Pressure Monitoring: not checking Retinal Examination: Not up to Date Foot Exam: Up to Date Diabetic Education: Not Completed Pneumovax: Declined Influenza: Postpone to flu season Aspirin: no  Relevant past medical, surgical, family and social history reviewed and updated as indicated. Interim medical history since our last visit reviewed. Allergies and medications reviewed and updated.  Review of Systems  Constitutional: Negative.   Respiratory: Negative.   Cardiovascular: Negative.   Psychiatric/Behavioral: Negative.     Per HPI unless specifically indicated above     Objective:    BP 113/79 (BP Location: Left Arm, Patient Position: Sitting, Cuff Size: Large)   Pulse 76   Temp 98.3 F (36.8 C)   Wt 279 lb 5 oz (126.7 kg)   LMP 02/08/2017 (Approximate)   SpO2 96%   BMI 49.09 kg/m   Wt Readings from Last 3 Encounters:  03/12/17 279 lb 5 oz (126.7 kg)  11/29/16 275 lb (124.7 kg)  08/02/16 274 lb 9.6 oz (124.6 kg)    Physical Exam  Constitutional: She is oriented to person, place, and time. She appears well-developed and well-nourished. No distress.  HENT:  Head: Normocephalic and atraumatic.  Right Ear: Hearing normal.  Left Ear: Hearing normal.  Eyes: Pupils are equal, round, and reactive to light. Conjunctivae, EOM and lids are normal. Right eye exhibits no discharge. Left eye  exhibits no discharge. No scleral icterus.  Cardiovascular: Normal rate, regular rhythm, normal heart sounds and intact distal pulses.  Exam reveals no gallop and no friction rub.   No murmur heard. Pulmonary/Chest: Effort normal and breath sounds normal. No respiratory distress. She has no wheezes. She has no rales. She exhibits no tenderness.  Musculoskeletal: Normal range of motion.  Neurological: She is alert and oriented to person, place, and time.  Skin: Skin is warm, dry and intact. No rash noted. No erythema. No pallor.  Psychiatric: She has a normal mood and affect. Her speech is normal and behavior is normal. Judgment and thought content normal. Cognition and memory are normal.  Nursing note and vitals reviewed.   Results for orders placed or performed in visit on 11/29/16  WET PREP FOR TRICH, YEAST, CLUE  Result Value Ref Range   Trichomonas Exam Negative Negative   Yeast Exam Negative Negative   Clue Cell Exam Negative Negative  Bayer DCA Hb A1c Waived  Result Value Ref Range   Bayer DCA Hb A1c Waived 6.7 <7.0 %  Comprehensive metabolic panel  Result Value Ref Range   Glucose 155 (H) 65 - 99 mg/dL   BUN 16 6 - 20 mg/dL   Creatinine, Ser 4.09 0.57 - 1.00 mg/dL   GFR calc non Af Amer 111 >59 mL/min/1.73   GFR calc Af Amer 128 >59 mL/min/1.73   BUN/Creatinine Ratio 23 9 - 23   Sodium 140 134 - 144 mmol/L  Potassium 4.2 3.5 - 5.2 mmol/L   Chloride 101 96 - 106 mmol/L   CO2 23 18 - 29 mmol/L   Calcium 9.3 8.7 - 10.2 mg/dL   Total Protein 7.1 6.0 - 8.5 g/dL   Albumin 4.2 3.5 - 5.5 g/dL   Globulin, Total 2.9 1.5 - 4.5 g/dL   Albumin/Globulin Ratio 1.4 1.2 - 2.2   Bilirubin Total 0.3 0.0 - 1.2 mg/dL   Alkaline Phosphatase 52 39 - 117 IU/L   AST 12 0 - 40 IU/L   ALT 22 0 - 32 IU/L  Magnesium  Result Value Ref Range   Magnesium 1.8 1.6 - 2.3 mg/dL  Phosphorus  Result Value Ref Range   Phosphorus 4.3 2.5 - 4.5 mg/dL  Ferritin  Result Value Ref Range   Ferritin 53 15  - 150 ng/mL  CBC with Differential/Platelet  Result Value Ref Range   WBC 8.1 3.4 - 10.8 x10E3/uL   RBC 4.60 3.77 - 5.28 x10E6/uL   Hemoglobin 13.9 11.1 - 15.9 g/dL   Hematocrit 16.142.0 09.634.0 - 46.6 %   MCV 91 79 - 97 fL   MCH 30.2 26.6 - 33.0 pg   MCHC 33.1 31.5 - 35.7 g/dL   RDW 04.514.7 40.912.3 - 81.115.4 %   Platelets 371 150 - 379 x10E3/uL   Neutrophils 60 Not Estab. %   Lymphs 25 Not Estab. %   Monocytes 9 Not Estab. %   Eos 5 Not Estab. %   Basos 1 Not Estab. %   Neutrophils Absolute 4.9 1.4 - 7.0 x10E3/uL   Lymphocytes Absolute 2.1 0.7 - 3.1 x10E3/uL   Monocytes Absolute 0.7 0.1 - 0.9 x10E3/uL   EOS (ABSOLUTE) 0.4 0.0 - 0.4 x10E3/uL   Basophils Absolute 0.1 0.0 - 0.2 x10E3/uL   Immature Granulocytes 0 Not Estab. %   Immature Grans (Abs) 0.0 0.0 - 0.1 x10E3/uL  IGP, Aptima HPV, rfx 16/18,45  Result Value Ref Range   DIAGNOSIS: Comment (A)    Specimen adequacy: Comment    Clinician Provided ICD10 Comment    Performed by: Comment    Electronically signed by: Comment    PAP Smear Comment .    PATHOLOGIST PROVIDED ICD10: Comment    Note: Comment    Test Methodology Comment    HPV Aptima Negative Negative      Assessment & Plan:   Problem List Items Addressed This Visit      Endocrine   Diabetes mellitus type 2, uncomplicated (HCC) - Primary    A1c stable at 6.7 off meds. Continue diet and exercise. Recheck 6 months. Call with any concerns.       Relevant Orders   Bayer DCA Hb A1c Waived       Follow up plan: Return in about 6 months (around 09/12/2017) for diabetes follow up.

## 2017-03-21 LAB — BAYER DCA HB A1C WAIVED: HB A1C: 6.7 % (ref <7.0)

## 2017-04-18 ENCOUNTER — Emergency Department
Admission: EM | Admit: 2017-04-18 | Discharge: 2017-04-18 | Disposition: A | Discharge status: Home / Self Care | Payer: BC Managed Care – PPO | Attending: Emergency Medicine | Admitting: Emergency Medicine

## 2017-04-18 DIAGNOSIS — S161XXA Strain of muscle, fascia and tendon at neck level, initial encounter: Secondary | ICD-10-CM | POA: Insufficient documentation

## 2017-04-18 DIAGNOSIS — Y929 Unspecified place or not applicable: Secondary | ICD-10-CM | POA: Diagnosis not present

## 2017-04-18 DIAGNOSIS — Y999 Unspecified external cause status: Secondary | ICD-10-CM | POA: Diagnosis not present

## 2017-04-18 DIAGNOSIS — Z79899 Other long term (current) drug therapy: Secondary | ICD-10-CM | POA: Diagnosis not present

## 2017-04-18 DIAGNOSIS — Y939 Activity, unspecified: Secondary | ICD-10-CM | POA: Insufficient documentation

## 2017-04-18 DIAGNOSIS — M62838 Other muscle spasm: Secondary | ICD-10-CM | POA: Diagnosis not present

## 2017-04-18 DIAGNOSIS — H9312 Tinnitus, left ear: Secondary | ICD-10-CM | POA: Diagnosis not present

## 2017-04-18 DIAGNOSIS — E119 Type 2 diabetes mellitus without complications: Secondary | ICD-10-CM | POA: Insufficient documentation

## 2017-04-18 DIAGNOSIS — R51 Headache: Secondary | ICD-10-CM | POA: Diagnosis present

## 2017-04-18 MED ORDER — CYCLOBENZAPRINE HCL 5 MG PO TABS: 5.0000 mg | ORAL_TABLET | Freq: Three times a day (TID) | ORAL | 0 refills | Status: DC | PRN

## 2017-04-18 MED ORDER — IBUPROFEN 800 MG PO TABS: 800.0000 mg | ORAL_TABLET | Freq: Three times a day (TID) | ORAL | 0 refills | Status: DC | PRN

## 2017-04-18 MED ORDER — IBUPROFEN 800 MG PO TABS
800.0000 mg | ORAL_TABLET | Freq: Once | ORAL | Status: AC
  Administered 2017-04-18: 800 mg via ORAL
  Filled 2017-04-18: qty 1

## 2017-04-18 NOTE — Discharge Instructions (Signed)
Continue monitoring symptoms, if he noted symptoms significantly worsen do not hesitate to return the emergency department.  In addition to ibuprofen you may utilize cold packs or heat for symptom relief.
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
## 2017-04-30 ENCOUNTER — Ambulatory Visit: Payer: BC Managed Care – PPO | Admitting: Family Medicine

## 2017-05-01 ENCOUNTER — Ambulatory Visit (INDEPENDENT_AMBULATORY_CARE_PROVIDER_SITE_OTHER): Payer: BC Managed Care – PPO | Admitting: Family Medicine

## 2017-05-01 ENCOUNTER — Encounter: Payer: Self-pay | Admitting: Family Medicine

## 2017-05-01 VITALS — BP 135/74 | HR 79 | Temp 98.8°F | Wt 277.1 lb

## 2017-05-01 DIAGNOSIS — H6983 Other specified disorders of Eustachian tube, bilateral: Secondary | ICD-10-CM

## 2017-05-01 MED ORDER — PREDNISONE 50 MG PO TABS: 50.0000 mg | ORAL_TABLET | Freq: Every day | ORAL | 0 refills | Status: DC

## 2017-05-01 NOTE — Patient Instructions (Addendum)
Ear Barotrauma Ear barotrauma is injury to the eardrum that is caused by a pressure difference between the inside and the outside of the eardrum. Things that can make this happen include:  Flying in an airplane.  Coming to the surface too quickly after scuba diving.  Going to higher places (high altitudes) quickly.  Having an ear infection.  Being too close to a loud noise.  Being hit hard in the ear.  Working in a pressurized room.  Having swelling (inflammation) in your ear from a cold, an allergy, or an infection.  Follow these instructions at home:  Take medicines only as told by your doctor.  Do not do the following until your doctor says it is okay: ? Travel to places that are high above sea level. ? Work in a pressurized room. ? Scuba dive.  Use techniques to help with pressure changes in your ear as told by your doctor. These may include: ? Chewing gum. ? Swallowing. ? Holding your nose and gently blowing to pop your ears.  Keep your ears dry. Use the corner of a towel to gently get water out of your ears after a shower or bath.  Keep all follow-up visits as told by your doctor. This is important. Contact a doctor if:  Your symptoms do not get better, or they get worse.  You have a fever.  Your symptoms affect only one ear. Get help right away if:  You have a very bad headache.  You feel dizzy.  You have very bad ear pain.  You have blood or yellowish-white fluid (pus) coming from your ear.  You have hearing loss.  Your outer ear gets red and swollen.  You have swelling in the area behind your earlobe. This information is not intended to replace advice given to you by your health care provider. Make sure you discuss any questions you have with your health care provider. Document Released: 01/31/2010 Document Revised: 01/19/2016 Document Reviewed: 03/29/2014 Elsevier Interactive Patient Education  2018 [employer]  
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
## 2017-09-17 ENCOUNTER — Encounter: Payer: Self-pay | Admitting: Family Medicine

## 2017-09-17 ENCOUNTER — Ambulatory Visit (INDEPENDENT_AMBULATORY_CARE_PROVIDER_SITE_OTHER): Payer: BC Managed Care – PPO | Admitting: Family Medicine

## 2017-09-17 VITALS — BP 130/82 | HR 86 | Temp 99.2°F | Wt 279.8 lb

## 2017-09-17 DIAGNOSIS — M25561 Pain in right knee: Secondary | ICD-10-CM | POA: Diagnosis not present

## 2017-09-17 DIAGNOSIS — E119 Type 2 diabetes mellitus without complications: Secondary | ICD-10-CM

## 2017-09-17 DIAGNOSIS — G8929 Other chronic pain: Secondary | ICD-10-CM | POA: Diagnosis not present

## 2017-09-17 LAB — UA/M W/RFLX CULTURE, ROUTINE
Bilirubin, UA: NEGATIVE
Glucose, UA: NEGATIVE
KETONES UA: NEGATIVE
Leukocytes, UA: NEGATIVE
NITRITE UA: NEGATIVE
UUROB: 0.2 mg/dL (ref 0.2–1.0)
pH, UA: 5.5 (ref 5.0–7.5)

## 2017-09-17 LAB — MICROSCOPIC EXAMINATION

## 2017-09-17 LAB — MICROALBUMIN, URINE WAIVED
Creatinine, Urine Waived: 300 mg/dL (ref 10–300)
Microalb, Ur Waived: 80 mg/L — ABNORMAL HIGH (ref 0–19)

## 2017-09-17 LAB — BAYER DCA HB A1C WAIVED: HB A1C (BAYER DCA - WAIVED): 8.1 % — ABNORMAL HIGH (ref <7.0)

## 2017-09-17 MED ORDER — METFORMIN HCL 500 MG PO TABS: 1000.0000 mg | ORAL_TABLET | Freq: Every day | ORAL | 1 refills | Status: DC

## 2017-09-17 NOTE — Assessment & Plan Note (Signed)
Not under good control with A1c of 8.1. Will restart 1000mg  metformin daily and recheck 3 months at physical.

## 2017-09-17 NOTE — Patient Instructions (Addendum)

## 2017-09-17 NOTE — Progress Notes (Signed)
BP 130/82   Pulse 86   Temp 99.2 F (37.3 C) (Oral)   Wt 279 lb 12.8 oz (126.9 kg)   SpO2 96%   BMI 49.56 kg/m    Subjective:    Patient ID: Mary Drake, female    DOB: [birth year], 37 y.o.   MRN: [id number]  HPI: Mary Drake is a 37 y.o. female  Chief Complaint  Patient presents with  . Diabetes    pt states she has not ahd a recent eye exam, but is planning to call today to schedule    DIABETES Hypoglycemic episodes:no Polydipsia/polyuria: no Visual disturbance: no Chest pain: no Paresthesias: no Glucose Monitoring: no  Accucheck frequency: Not Checking Taking Insulin?: no Blood Pressure Monitoring: not checking Retinal Examination: Not up to Date Foot Exam: Up to Date Diabetic Education: Completed Pneumovax: Not Up to Date Influenza: Up to Date Aspirin: no  Was having a lot of issues with her R knee a few weeks ago. It was really tight and hard to bend. She notes that she would pop it and it would start to feel better.   Relevant past medical, surgical, family and social history reviewed and updated as indicated. Interim medical history since our last visit reviewed. Allergies and medications reviewed and updated.  Review of Systems  Constitutional: Negative.   Respiratory: Negative.   Cardiovascular: Negative.   Musculoskeletal: Positive for arthralgias and joint swelling. Negative for back pain, gait problem, myalgias, neck pain and neck stiffness.  Neurological: Negative.   Psychiatric/Behavioral: Negative.     Per HPI unless specifically indicated above     Objective:    BP 130/82   Pulse 86   Temp 99.2 F (37.3 C) (Oral)   Wt 279 lb 12.8 oz (126.9 kg)   SpO2 96%   BMI 49.56 kg/m   Wt Readings from Last 3 Encounters:  09/17/17 279 lb 12.8 oz (126.9 kg)  05/01/17 277 lb 2 oz (125.7 kg)  04/18/17 272 lb (123.4 kg)    Physical Exam  Constitutional: She is oriented to person, place, and time. She appears well-developed and  well-nourished. No distress.  HENT:  Head: Normocephalic and atraumatic.  Right Ear: Hearing normal.  Left Ear: Hearing normal.  Nose: Nose normal.  Eyes: Conjunctivae and lids are normal. Right eye exhibits no discharge. Left eye exhibits no discharge. No scleral icterus.  Cardiovascular: Normal rate, regular rhythm, normal heart sounds and intact distal pulses. Exam reveals no gallop and no friction rub.  No murmur heard. Pulmonary/Chest: Effort normal and breath sounds normal. No respiratory distress. She has no wheezes. She has no rales. She exhibits no tenderness.  Musculoskeletal: Normal range of motion. She exhibits no edema, tenderness or deformity.  Neurological: She is alert and oriented to person, place, and time.  Skin: Skin is warm, dry and intact. No rash noted. She is not diaphoretic. No erythema. No pallor.  Psychiatric: She has a normal mood and affect. Her speech is normal and behavior is normal. Judgment and thought content normal. Cognition and memory are normal.    Results for orders placed or performed in visit on 03/12/17  Bayer DCA Hb A1c Waived  Result Value Ref Range   Bayer DCA Hb A1c Waived 6.7 <7.0 %      Assessment & Plan:   Problem List Items Addressed This Visit      Endocrine   Diabetes mellitus type 2, uncomplicated (HCC) - Primary    Not under good control with  A1c of 8.1. Will restart 1000mg  metformin daily and recheck 3 months at physical.       Relevant Medications   metFORMIN (GLUCOPHAGE) 500 MG tablet   Other Relevant Orders   Bayer DCA Hb A1c Waived   CBC with Differential/Platelet   Lipid Panel w/o Chol/HDL Ratio   Microalbumin, Urine Waived   Comprehensive metabolic panel   TSH   UA/M w/rflx Culture, Routine    Other Visit Diagnoses    Chronic pain of right knee       Will obtain x-ray when she has the funds. Await results. Exercises given. Call with any concerns.    Relevant Orders   DG Knee Complete 4 Views Right        Follow up plan: Return in about 3 months (around 12/16/2017) for Physical with pap and DM visit.

## 2017-09-18 ENCOUNTER — Encounter: Payer: Self-pay | Admitting: Family Medicine

## 2017-09-18 LAB — COMPREHENSIVE METABOLIC PANEL
A/G RATIO: 1.4 (ref 1.2–2.2)
ALT: 31 IU/L (ref 0–32)
AST: 18 IU/L (ref 0–40)
Albumin: 4.2 g/dL (ref 3.5–5.5)
Alkaline Phosphatase: 57 IU/L (ref 39–117)
BUN / CREAT RATIO: 14 (ref 9–23)
BUN: 10 mg/dL (ref 6–20)
Bilirubin Total: 0.4 mg/dL (ref 0.0–1.2)
CALCIUM: 8.9 mg/dL (ref 8.7–10.2)
CO2: 23 mmol/L (ref 20–29)
Chloride: 99 mmol/L (ref 96–106)
Creatinine, Ser: 0.69 mg/dL (ref 0.57–1.00)
GFR, EST AFRICAN AMERICAN: 130 mL/min/{1.73_m2} (ref >59)
GFR, EST NON AFRICAN AMERICAN: 112 mL/min/{1.73_m2} (ref >59)
GLOBULIN, TOTAL: 3 g/dL (ref 1.5–4.5)
Glucose: 215 mg/dL — ABNORMAL HIGH (ref 65–99)
POTASSIUM: 4.5 mmol/L (ref 3.5–5.2)
SODIUM: 139 mmol/L (ref 134–144)
TOTAL PROTEIN: 7.2 g/dL (ref 6.0–8.5)

## 2017-09-18 LAB — CBC WITH DIFFERENTIAL/PLATELET
BASOS ABS: 0.1 10*3/uL (ref 0.0–0.2)
Basos: 2 %
EOS (ABSOLUTE): 0.4 10*3/uL (ref 0.0–0.4)
Eos: 5 %
Hematocrit: 41.7 % (ref 34.0–46.6)
Hemoglobin: 14 g/dL (ref 11.1–15.9)
Immature Grans (Abs): 0 10*3/uL (ref 0.0–0.1)
Immature Granulocytes: 0 %
LYMPHS: 35 %
Lymphocytes Absolute: 2.7 10*3/uL (ref 0.7–3.1)
MCH: 30.5 pg (ref 26.6–33.0)
MCHC: 33.6 g/dL (ref 31.5–35.7)
MCV: 91 fL (ref 79–97)
Monocytes Absolute: 0.7 10*3/uL (ref 0.1–0.9)
Monocytes: 9 %
NEUTROS ABS: 3.8 10*3/uL (ref 1.4–7.0)
Neutrophils: 49 %
PLATELETS: 398 10*3/uL — AB (ref 150–379)
RBC: 4.59 x10E6/uL (ref 3.77–5.28)
RDW: 14.1 % (ref 12.3–15.4)
WBC: 7.7 10*3/uL (ref 3.4–10.8)

## 2017-09-18 LAB — TSH: TSH: 3.3 u[IU]/mL (ref 0.450–4.500)

## 2017-09-18 LAB — LIPID PANEL W/O CHOL/HDL RATIO
CHOLESTEROL TOTAL: 152 mg/dL (ref 100–199)
HDL: 32 mg/dL — ABNORMAL LOW (ref >39)
LDL Calculated: 92 mg/dL (ref 0–99)
TRIGLYCERIDES: 138 mg/dL (ref 0–149)
VLDL Cholesterol Cal: 28 mg/dL (ref 5–40)

## 2017-12-20 ENCOUNTER — Encounter: Payer: BC Managed Care – PPO | Admitting: Family Medicine

## 2018-01-10 ENCOUNTER — Encounter: Payer: BC Managed Care – PPO | Admitting: Family Medicine

## 2018-01-31 ENCOUNTER — Ambulatory Visit (INDEPENDENT_AMBULATORY_CARE_PROVIDER_SITE_OTHER): Payer: BC Managed Care – PPO | Admitting: Family Medicine

## 2018-01-31 ENCOUNTER — Encounter: Payer: Self-pay | Admitting: Family Medicine

## 2018-01-31 VITALS — BP 121/74 | HR 94 | Temp 98.3°F | Ht 63.0 in | Wt 281.6 lb

## 2018-01-31 DIAGNOSIS — E119 Type 2 diabetes mellitus without complications: Secondary | ICD-10-CM

## 2018-01-31 DIAGNOSIS — Z0000 Encounter for general adult medical examination without abnormal findings: Secondary | ICD-10-CM

## 2018-01-31 DIAGNOSIS — R87612 Low grade squamous intraepithelial lesion on cytologic smear of cervix (LGSIL): Secondary | ICD-10-CM | POA: Diagnosis not present

## 2018-01-31 LAB — MICROSCOPIC EXAMINATION

## 2018-01-31 LAB — UA/M W/RFLX CULTURE, ROUTINE
Bilirubin, UA: NEGATIVE
LEUKOCYTES UA: NEGATIVE
NITRITE UA: NEGATIVE
PH UA: 6 (ref 5.0–7.5)
Specific Gravity, UA: 1.015 (ref 1.005–1.030)
Urobilinogen, Ur: 1 mg/dL (ref 0.2–1.0)

## 2018-01-31 LAB — MICROALBUMIN, URINE WAIVED
CREATININE, URINE WAIVED: 200 mg/dL (ref 10–300)
Microalb, Ur Waived: 80 mg/L — ABNORMAL HIGH (ref 0–19)

## 2018-01-31 LAB — BAYER DCA HB A1C WAIVED: HB A1C (BAYER DCA - WAIVED): 8.4 % — ABNORMAL HIGH (ref <7.0)

## 2018-01-31 NOTE — Assessment & Plan Note (Signed)
Pap done today. Await results.  

## 2018-01-31 NOTE — Progress Notes (Signed)
BP 121/74 (BP Location: Left Arm, Patient Position: Sitting, Cuff Size: Large)   Pulse 94   Temp 98.3 F (36.8 C)   Ht 5\' 3"  (1.6 m)   Wt 281 lb 9 oz (127.7 kg)   SpO2 95%   BMI 49.88 kg/m    Subjective:    Patient ID: Mary Drake, female    DOB: 05-04-1981, 37 y.o.   MRN: [id number]  HPI: [name] is a 37 y.o. female presenting on 01/31/2018 for comprehensive medical examination. Current medical complaints include:  DIABETES- has not been taking her metformin.  Hypoglycemic episodes:no Polydipsia/polyuria: no Visual disturbance: no Chest pain: no Paresthesias: no Glucose Monitoring: no Taking Insulin?: no Blood Pressure Monitoring: not checking Retinal Examination: Not up to Date Foot Exam: Up to Date Diabetic Education: Completed Pneumovax: Not up to Date Influenza: Not up to Date Aspirin: no  Menopausal Symptoms: no  Depression Screen done today and results listed below:  Depression screen [facility] 2/9 01/31/2018 11/29/2016  Decreased Interest 2 0  Down, Depressed, Hopeless 0 0  PHQ - 2 Score 2 0  Altered sleeping 2 -  Tired, decreased energy 2 -  Change in appetite 2 -  Feeling bad or failure about yourself  0 -  Trouble concentrating 0 -  Moving slowly or fidgety/restless 0 -  Suicidal thoughts 0 -  PHQ-9 Score 8 -  Difficult doing work/chores Not difficult at all -   Past Medical History:  Past Medical History:  Diagnosis Date  . Migraines   . Psoriasis     Surgical History:  Past Surgical History:  Procedure Laterality Date  . CHOLECYSTECTOMY  2010    Medications:  Current Outpatient Medications on File Prior to Visit  Medication Sig  . azelastine (ASTELIN) 0.1 % nasal spray USE 1 SPRAY INTO EACH NOSTRIL TWICE A DAY  . cetirizine (ZYRTEC) 10 MG tablet Take 10 mg by mouth daily.  Marland Kitchen desonide (DESOWEN) 0.05 % cream Apply topically.  . folic acid (FOLVITE) 800 MCG tablet Take 400 mcg by mouth daily.  . metFORMIN (GLUCOPHAGE) 500 MG  tablet Take 2 tablets (1,000 mg total) by mouth daily with breakfast. (Patient not taking: Reported on 01/31/2018)  . methotrexate (RHEUMATREX) 2.5 MG tablet Take 20 mg by mouth once a week. 6 tablets weekly    No current facility-administered medications on file prior to visit.     Allergies:  Allergies  Allergen Reactions  . Codeine     Social History:  Social History   Socioeconomic History  . Marital status: Single    Spouse name: Not on file  . Number of children: Not on file  . Years of education: Not on file  . Highest education level: Not on file  Occupational History  . Not on file  Social Needs  . Financial resource strain: Not on file  . Food insecurity:    Worry: Not on file    Inability: Not on file  . Transportation needs:    Medical: Not on file    Non-medical: Not on file  Tobacco Use  . Smoking status: Never Smoker  . Smokeless tobacco: Never Used  Substance and Sexual Activity  . Alcohol use: Yes    Comment: 0n occasion  . Drug use: No  . Sexual activity: Yes    Birth control/protection: None  Lifestyle  . Physical activity:    Days per week: Not on file    Minutes per session: Not on file  .  Stress: Not on file  Relationships  . Social connections:    Talks on phone: Not on file    Gets together: Not on file    Attends religious service: Not on file    Active member of club or organization: Not on file    Attends meetings of clubs or organizations: Not on file    Relationship status: Not on file  . Intimate partner violence:    Fear of current or ex partner: Not on file    Emotionally abused: Not on file    Physically abused: Not on file    Forced sexual activity: Not on file  Other Topics Concern  . Not on file  Social History Narrative  . Not on file   Social History   Tobacco Use  Smoking Status Never Smoker  Smokeless Tobacco Never Used   Social History   Substance and Sexual Activity  Alcohol Use Yes   Comment: 0n occasion     Family History:  Family History  Problem Relation Age of Onset  . Diabetes Mother   . Hypertension Mother   . Diabetes Father     Past medical history, surgical history, medications, allergies, family history and social history reviewed with patient today and changes made to appropriate areas of the chart.   Review of Systems  Constitutional: Negative.   HENT: Negative.   Eyes: Negative.   Respiratory: Positive for wheezing. Negative for cough, hemoptysis, sputum production and shortness of breath.   Cardiovascular: Positive for palpitations. Negative for chest pain, orthopnea, claudication, leg swelling and PND.  Gastrointestinal: Negative.   Genitourinary: Negative.   Musculoskeletal: Negative.   Skin: Negative.   Neurological: Negative.   Endo/Heme/Allergies: Positive for environmental allergies. Negative for polydipsia. Does not bruise/bleed easily.  Psychiatric/Behavioral: Negative.     All other ROS negative except what is listed above and in the HPI.      Objective:    BP 121/74 (BP Location: Left Arm, Patient Position: Sitting, Cuff Size: Large)   Pulse 94   Temp 98.3 F (36.8 C)   Ht 5\' 3"  (1.6 m)   Wt 281 lb 9 oz (127.7 kg)   SpO2 95%   BMI 49.88 kg/m   Wt Readings from Last 3 Encounters:  01/31/18 281 lb 9 oz (127.7 kg)  09/17/17 279 lb 12.8 oz (126.9 kg)  05/01/17 277 lb 2 oz (125.7 kg)    Physical Exam  Constitutional: She is oriented to person, place, and time. She appears well-developed and well-nourished. No distress.  HENT:  Head: Normocephalic and atraumatic.  Right Ear: Hearing and external ear normal.  Left Ear: Hearing and external ear normal.  Nose: Nose normal.  Mouth/Throat: Oropharynx is clear and moist. No oropharyngeal exudate.  Eyes: Pupils are equal, round, and reactive to light. Conjunctivae, EOM and lids are normal. Right eye exhibits no discharge. Left eye exhibits no discharge. No scleral icterus.  Neck: Normal range of  motion. Neck supple. No JVD present. No tracheal deviation present. No thyromegaly present.  Cardiovascular: Normal rate, regular rhythm, normal heart sounds and intact distal pulses. Exam reveals no gallop and no friction rub.  No murmur heard. Pulmonary/Chest: Effort normal and breath sounds normal. No stridor. No respiratory distress. She has no wheezes. She has no rales. She exhibits no tenderness. Right breast exhibits no inverted nipple, no mass, no nipple discharge, no skin change and no tenderness. Left breast exhibits no inverted nipple, no mass, no nipple discharge, no skin change  and no tenderness. No breast swelling, tenderness, discharge or bleeding. Breasts are symmetrical.  Abdominal: Soft. Bowel sounds are normal. She exhibits no distension and no mass. There is no tenderness. There is no rebound and no guarding. No hernia. Hernia confirmed negative in the right inguinal area and confirmed negative in the left inguinal area.  Genitourinary: Vagina normal and uterus normal. No labial fusion. There is no rash, tenderness, lesion or injury on the right labia. There is no rash, tenderness, lesion or injury on the left labia. Uterus is not deviated, not enlarged, not fixed and not tender. Cervix exhibits no motion tenderness, no discharge and no friability. Right adnexum displays no mass, no tenderness and no fullness. Left adnexum displays no mass, no tenderness and no fullness. No erythema, tenderness or bleeding in the vagina. No foreign body in the vagina. No signs of injury around the vagina. No vaginal discharge found.  Musculoskeletal: Normal range of motion. She exhibits no edema, tenderness or deformity.  Lymphadenopathy:    She has no cervical adenopathy. No inguinal adenopathy noted on the right or left side.  Neurological: She is alert and oriented to person, place, and time. She displays normal reflexes. No cranial nerve deficit or sensory deficit. She exhibits normal muscle tone.  Coordination normal.  Skin: Skin is warm, dry and intact. Capillary refill takes less than 2 seconds. No rash noted. She is not diaphoretic. No erythema. No pallor.  Psychiatric: She has a normal mood and affect. Her speech is normal and behavior is normal. Judgment and thought content normal. Cognition and memory are normal.  Nursing note and vitals reviewed.   Results for orders placed or performed in visit on 09/17/17  Microscopic Examination  Result Value Ref Range   WBC, UA 0-5 0 - 5 /hpf   RBC, UA 0-2 0 - 2 /hpf   Epithelial Cells (non renal) 0-10 0 - 10 /hpf   Casts Present None seen /lpf   Cast Type Hyaline casts N/A   Mucus, UA Present Not Estab.   Bacteria, UA Few None seen/Few  Bayer DCA Hb A1c Waived  Result Value Ref Range   HB A1C (BAYER DCA - WAIVED) 8.1 (H) <7.0 %  CBC with Differential/Platelet  Result Value Ref Range   WBC 7.7 3.4 - 10.8 x10E3/uL   RBC 4.59 3.77 - 5.28 x10E6/uL   Hemoglobin 14.0 11.1 - 15.9 g/dL   Hematocrit 28.441.7 13.234.0 - 46.6 %   MCV 91 79 - 97 fL   MCH 30.5 26.6 - 33.0 pg   MCHC 33.6 31.5 - 35.7 g/dL   RDW 44.014.1 10.212.3 - 72.515.4 %   Platelets 398 (H) 150 - 379 x10E3/uL   Neutrophils 49 Not Estab. %   Lymphs 35 Not Estab. %   Monocytes 9 Not Estab. %   Eos 5 Not Estab. %   Basos 2 Not Estab. %   Neutrophils Absolute 3.8 1.4 - 7.0 x10E3/uL   Lymphocytes Absolute 2.7 0.7 - 3.1 x10E3/uL   Monocytes Absolute 0.7 0.1 - 0.9 x10E3/uL   EOS (ABSOLUTE) 0.4 0.0 - 0.4 x10E3/uL   Basophils Absolute 0.1 0.0 - 0.2 x10E3/uL   Immature Granulocytes 0 Not Estab. %   Immature Grans (Abs) 0.0 0.0 - 0.1 x10E3/uL  Lipid Panel w/o Chol/HDL Ratio  Result Value Ref Range   Cholesterol, Total 152 100 - 199 mg/dL   Triglycerides 366138 0 - 149 mg/dL   HDL 32 (L) >44>39 mg/dL   VLDL Cholesterol Cal 28  5 - 40 mg/dL   LDL Calculated 92 0 - 99 mg/dL  Microalbumin, Urine Waived  Result Value Ref Range   Microalb, Ur Waived 80 (H) 0 - 19 mg/L   Creatinine, Urine Waived 300  10 - 300 mg/dL   Microalb/Creat Ratio 30-300 (H) <30 mg/g  Comprehensive metabolic panel  Result Value Ref Range   Glucose 215 (H) 65 - 99 mg/dL   BUN 10 6 - 20 mg/dL   Creatinine, Ser 1.61 0.57 - 1.00 mg/dL   GFR calc non Af Amer 112 >59 mL/min/1.73   GFR calc Af Amer 130 >59 mL/min/1.73   BUN/Creatinine Ratio 14 9 - 23   Sodium 139 134 - 144 mmol/L   Potassium 4.5 3.5 - 5.2 mmol/L   Chloride 99 96 - 106 mmol/L   CO2 23 20 - 29 mmol/L   Calcium 8.9 8.7 - 10.2 mg/dL   Total Protein 7.2 6.0 - 8.5 g/dL   Albumin 4.2 3.5 - 5.5 g/dL   Globulin, Total 3.0 1.5 - 4.5 g/dL   Albumin/Globulin Ratio 1.4 1.2 - 2.2   Bilirubin Total 0.4 0.0 - 1.2 mg/dL   Alkaline Phosphatase 57 39 - 117 IU/L   AST 18 0 - 40 IU/L   ALT 31 0 - 32 IU/L  TSH  Result Value Ref Range   TSH 3.300 0.450 - 4.500 uIU/mL  UA/M w/rflx Culture, Routine  Result Value Ref Range   Specific Gravity, UA >1.030 (H) 1.005 - 1.030   pH, UA 5.5 5.0 - 7.5   Color, UA Orange Yellow   Appearance Ur Clear Clear   Leukocytes, UA Negative Negative   Protein, UA 1+ (A) Negative/Trace   Glucose, UA Negative Negative   Ketones, UA Negative Negative   RBC, UA Trace (A) Negative   Bilirubin, UA Negative Negative   Urobilinogen, Ur 0.2 0.2 - 1.0 mg/dL   Nitrite, UA Negative Negative   Microscopic Examination See below:       Assessment & Plan:   Problem List Items Addressed This Visit      Endocrine   Diabetes mellitus type 2, uncomplicated (HCC)    A1c up a bit to 8.4. Restart metformin. Recheck 3 months. Call with any concerns.       Relevant Orders   Bayer DCA Hb A1c Waived   Comprehensive metabolic panel   Lipid Panel w/o Chol/HDL Ratio   Microalbumin, Urine Waived   Ambulatory referral to Ophthalmology     Other   LGSIL on Pap smear of cervix    Pap done today. Await results.       Relevant Orders   IGP, Aptima HPV, rfx 16/18,45    Other Visit Diagnoses    Routine general medical examination at a health  care facility    -  Primary   Vaccines up to date. Screening labs checked today. Pap done. Continue diet and exercise. Call with any concerns.    Relevant Orders   Bayer DCA Hb A1c Waived   CBC with Differential/Platelet   Comprehensive metabolic panel   Lipid Panel w/o Chol/HDL Ratio   Microalbumin, Urine Waived   TSH   UA/M w/rflx Culture, Routine       Follow up plan: Return in about 3 months (around 05/03/2018) for Follow up DM.   LABORATORY TESTING:  - Pap smear: pap done  IMMUNIZATIONS:   - Tdap: Tetanus vaccination status reviewed: last tetanus booster within 10 years. - Influenza: Postponed to flu season -  Pneumovax: Refused  PATIENT COUNSELING:   Advised to take 1 mg of folate supplement per day if capable of pregnancy.   Sexuality: Discussed sexually transmitted diseases, partner selection, use of condoms, avoidance of unintended pregnancy  and contraceptive alternatives.   Advised to avoid cigarette smoking.  I discussed with the patient that most people either abstain from alcohol or drink within safe limits (<=14/week and <=4 drinks/occasion for males, <=7/weeks and <= 3 drinks/occasion for females) and that the risk for alcohol disorders and other health effects rises proportionally with the number of drinks per week and how often a drinker exceeds daily limits.  Discussed cessation/primary prevention of drug use and availability of treatment for abuse.   Diet: Encouraged to adjust caloric intake to maintain  or achieve ideal body weight, to reduce intake of dietary saturated fat and total fat, to limit sodium intake by avoiding high sodium foods and not adding table salt, and to maintain adequate dietary potassium and calcium preferably from fresh fruits, vegetables, and low-fat dairy products.    stressed the importance of regular exercise  Injury prevention: Discussed safety belts, safety helmets, smoke detector, smoking near bedding or upholstery.   Dental  health: Discussed importance of regular tooth brushing, flossing, and dental visits.    NEXT PREVENTATIVE PHYSICAL DUE IN 1 YEAR. Return in about 3 months (around 05/03/2018) for Follow up DM.

## 2018-01-31 NOTE — Patient Instructions (Signed)

## 2018-01-31 NOTE — Assessment & Plan Note (Signed)
A1c up a bit to 8.4. Restart metformin. Recheck 3 months. Call with any concerns.

## 2018-02-01 LAB — LIPID PANEL W/O CHOL/HDL RATIO
Cholesterol, Total: 164 mg/dL (ref 100–199)
HDL: 34 mg/dL — AB (ref >39)
LDL Calculated: 78 mg/dL (ref 0–99)
TRIGLYCERIDES: 262 mg/dL — AB (ref 0–149)
VLDL Cholesterol Cal: 52 mg/dL — ABNORMAL HIGH (ref 5–40)

## 2018-02-01 LAB — CBC WITH DIFFERENTIAL/PLATELET
BASOS: 1 %
Basophils Absolute: 0.1 10*3/uL (ref 0.0–0.2)
EOS (ABSOLUTE): 0.5 10*3/uL — AB (ref 0.0–0.4)
Eos: 6 %
Hematocrit: 42.8 % (ref 34.0–46.6)
Hemoglobin: 14.3 g/dL (ref 11.1–15.9)
Immature Grans (Abs): 0 10*3/uL (ref 0.0–0.1)
Immature Granulocytes: 1 %
Lymphocytes Absolute: 2.5 10*3/uL (ref 0.7–3.1)
Lymphs: 29 %
MCH: 30.7 pg (ref 26.6–33.0)
MCHC: 33.4 g/dL (ref 31.5–35.7)
MCV: 92 fL (ref 79–97)
MONOS ABS: 0.8 10*3/uL (ref 0.1–0.9)
Monocytes: 10 %
NEUTROS ABS: 4.5 10*3/uL (ref 1.4–7.0)
Neutrophils: 53 %
PLATELETS: 378 10*3/uL (ref 150–450)
RBC: 4.66 x10E6/uL (ref 3.77–5.28)
RDW: 15.2 % (ref 12.3–15.4)
WBC: 8.4 10*3/uL (ref 3.4–10.8)

## 2018-02-01 LAB — COMPREHENSIVE METABOLIC PANEL
A/G RATIO: 1.4 (ref 1.2–2.2)
ALT: 40 IU/L — AB (ref 0–32)
AST: 27 IU/L (ref 0–40)
Albumin: 4.2 g/dL (ref 3.5–5.5)
Alkaline Phosphatase: 63 IU/L (ref 39–117)
BILIRUBIN TOTAL: 0.4 mg/dL (ref 0.0–1.2)
BUN/Creatinine Ratio: 15 (ref 9–23)
BUN: 11 mg/dL (ref 6–20)
CHLORIDE: 101 mmol/L (ref 96–106)
CO2: 22 mmol/L (ref 20–29)
Calcium: 8.7 mg/dL (ref 8.7–10.2)
Creatinine, Ser: 0.71 mg/dL (ref 0.57–1.00)
GFR calc non Af Amer: 110 mL/min/{1.73_m2} (ref >59)
GFR, EST AFRICAN AMERICAN: 127 mL/min/{1.73_m2} (ref >59)
Globulin, Total: 3 g/dL (ref 1.5–4.5)
Glucose: 259 mg/dL — ABNORMAL HIGH (ref 65–99)
Potassium: 4.7 mmol/L (ref 3.5–5.2)
Sodium: 136 mmol/L (ref 134–144)
Total Protein: 7.2 g/dL (ref 6.0–8.5)

## 2018-02-01 LAB — TSH: TSH: 1.29 u[IU]/mL (ref 0.450–4.500)

## 2018-02-03 ENCOUNTER — Encounter: Payer: Self-pay | Admitting: Family Medicine

## 2018-02-06 LAB — IGP, APTIMA HPV, RFX 16/18,45
HPV Aptima: POSITIVE — AB
PAP Smear Comment: 0

## 2018-02-07 ENCOUNTER — Telehealth: Payer: Self-pay | Admitting: Family Medicine

## 2018-02-07 DIAGNOSIS — R8789 Other abnormal findings in specimens from female genital organs: Secondary | ICD-10-CM

## 2018-02-07 DIAGNOSIS — R87618 Other abnormal cytological findings on specimens from cervix uteri: Secondary | ICD-10-CM

## 2018-02-07 DIAGNOSIS — R87612 Low grade squamous intraepithelial lesion on cytologic smear of cervix (LGSIL): Secondary | ICD-10-CM

## 2018-02-07 NOTE — Telephone Encounter (Signed)
Referral generated

## 2018-02-07 NOTE — Telephone Encounter (Signed)
Patient notified of results, she does not have a preference on a gyn.

## 2018-02-07 NOTE — Telephone Encounter (Signed)
Called patient to give her her results of her pap smear. It showed Low grade squamous epithelial lesion- or some abnormalities. She was also still positive for HPV. This is nothing to worry about right now, but we do need to get her into see the GYN to have a biopsy. Referral is pended. Just need to see who she'd like to see. OK to give her this message if she calls back. CRM generated.

## 2018-02-10 ENCOUNTER — Telehealth: Payer: Self-pay | Admitting: Obstetrics & Gynecology

## 2018-02-10 NOTE — Telephone Encounter (Signed)
[facility] referring for LGSIL on Pap smear of cervix, Pap smear abnormality of cervix/human papillomavirus (HPV) positive. Called and left voicemail for patient to call back.Marland Kitchen..Marland Kitchen

## 2018-02-20 ENCOUNTER — Ambulatory Visit: Payer: BC Managed Care – PPO | Admitting: Obstetrics and Gynecology

## 2018-03-19 ENCOUNTER — Ambulatory Visit (INDEPENDENT_AMBULATORY_CARE_PROVIDER_SITE_OTHER): Payer: BC Managed Care – PPO | Admitting: Obstetrics and Gynecology

## 2018-03-19 ENCOUNTER — Encounter: Payer: Self-pay | Admitting: Obstetrics and Gynecology

## 2018-03-19 ENCOUNTER — Other Ambulatory Visit (HOSPITAL_COMMUNITY)
Admission: RE | Admit: 2018-03-19 | Discharge: 2018-03-19 | Disposition: A | Discharge status: Home / Self Care | Payer: BC Managed Care – PPO | Source: Ambulatory Visit | Attending: Obstetrics and Gynecology | Admitting: Obstetrics and Gynecology

## 2018-03-19 VITALS — BP 120/80 | Ht 63.0 in | Wt 283.0 lb

## 2018-03-19 DIAGNOSIS — R87612 Low grade squamous intraepithelial lesion on cytologic smear of cervix (LGSIL): Secondary | ICD-10-CM | POA: Diagnosis present

## 2018-03-19 NOTE — Progress Notes (Signed)
   GYNECOLOGY CLINIC COLPOSCOPY PROCEDURE NOTE  37 y.o. No obstetric history on file. here for colposcopy for low-grade squamous intraepithelial neoplasia (LGSIL - encompassing HPV,mild dysplasia,CIN I)  pap smear on 01/31/18. Discussed underlying role for HPV infection in the development of cervical dysplasia, its natural history and progression/regression, need for surveillance.  Is the patient  pregnant: No LMP: Patient's last menstrual period was 03/11/2018. Smoking status:  reports that she has never smoked. She has never used smokeless tobacco. Contraception: none  Patient given informed consent, signed copy in the chart, time out was performed.  The patient was position in dorsal lithotomy position. Speculum was placed the cervix was visualized.   After application of acetic acid colposcopic inspection of the cervix was undertaken.   Colposcopy adequate, full visualization of transformation zone: No no visible lesions; corresponding biopsies obtained.   ECC specimen obtained:  Yes  All specimens were labeled and sent to pathology.   Patient was given post procedure instructions.  Will follow up pathology and manage accordingly.  Routine preventative health maintenance measures emphasized.  Physical Exam  Genitourinary:      Adelene Idlerhristanna Schuman MD [facility], [facility] 03/19/18 2:43 PM

## 2018-03-20 NOTE — Progress Notes (Signed)
Normal- Pap and cotesting in 12 months, released to mychart.

## 2018-04-15 LAB — HM DIABETES EYE EXAM

## 2018-05-05 ENCOUNTER — Ambulatory Visit: Payer: BC Managed Care – PPO | Admitting: Family Medicine

## 2018-05-15 ENCOUNTER — Ambulatory Visit: Payer: BC Managed Care – PPO | Admitting: Family Medicine

## 2018-05-15 ENCOUNTER — Encounter: Payer: Self-pay | Admitting: Family Medicine

## 2018-05-15 VITALS — BP 140/71 | HR 86 | Wt 280.2 lb

## 2018-05-15 DIAGNOSIS — E119 Type 2 diabetes mellitus without complications: Secondary | ICD-10-CM | POA: Diagnosis not present

## 2018-05-15 DIAGNOSIS — Z23 Encounter for immunization: Secondary | ICD-10-CM | POA: Diagnosis not present

## 2018-05-15 LAB — BAYER DCA HB A1C WAIVED: HB A1C (BAYER DCA - WAIVED): 8.9 % — ABNORMAL HIGH (ref <7.0)

## 2018-05-15 MED ORDER — METFORMIN HCL 500 MG PO TABS: 1000.0000 mg | ORAL_TABLET | Freq: Two times a day (BID) | ORAL | 1 refills | Status: DC

## 2018-05-15 NOTE — Assessment & Plan Note (Signed)
Doing worse with A1c of 8.9, up from 8.4. Will increase her metformin to 1000mg  BID and recheck 3 months.

## 2018-05-15 NOTE — Progress Notes (Signed)
BP 140/71 (BP Location: Left Arm, Patient Position: Sitting, Cuff Size: Large)   Pulse 86   Wt 280 lb 3 oz (127.1 kg)   SpO2 97%   BMI 49.63 kg/m    Subjective:    Patient ID: Mary Drake, female    DOB: 22-Oct-1980, 37 y.o.   MRN: [id number]  HPI: Mary Drake is a 37 y.o. female  Chief Complaint  Patient presents with  . Diabetes   DIABETES- has not been taking her medicine for the last month because she was moving.  Hypoglycemic episodes:no Polydipsia/polyuria: no Visual disturbance: no Chest pain: no Paresthesias: no Glucose Monitoring: no  Accucheck frequency: Not Checking Taking Insulin?: no Blood Pressure Monitoring: not checking Retinal Examination: Up to Date Foot Exam: Up to Date Diabetic Education: Completed Pneumovax: Given today Influenza: Given today Aspirin: no  Relevant past medical, surgical, family and social history reviewed and updated as indicated. Interim medical history since our last visit reviewed. Allergies and medications reviewed and updated.  Review of Systems  Constitutional: Negative.   Respiratory: Negative.   Cardiovascular: Negative.   Psychiatric/Behavioral: Negative.     Per HPI unless specifically indicated above     Objective:    BP 140/71 (BP Location: Left Arm, Patient Position: Sitting, Cuff Size: Large)   Pulse 86   Wt 280 lb 3 oz (127.1 kg)   SpO2 97%   BMI 49.63 kg/m   Wt Readings from Last 3 Encounters:  05/15/18 280 lb 3 oz (127.1 kg)  03/19/18 283 lb (128.4 kg)  01/31/18 281 lb 9 oz (127.7 kg)    Physical Exam  Constitutional: She is oriented to person, place, and time. She appears well-developed and well-nourished. No distress.  HENT:  Head: Normocephalic and atraumatic.  Right Ear: Hearing normal.  Left Ear: Hearing normal.  Nose: Nose normal.  Eyes: Conjunctivae and lids are normal. Right eye exhibits no discharge. Left eye exhibits no discharge. No scleral icterus.    Cardiovascular: Normal rate, regular rhythm, normal heart sounds and intact distal pulses. Exam reveals no gallop and no friction rub.  No murmur heard. Pulmonary/Chest: Effort normal and breath sounds normal. No stridor. No respiratory distress. She has no wheezes. She has no rales. She exhibits no tenderness.  Musculoskeletal: Normal range of motion.  Neurological: She is alert and oriented to person, place, and time.  Skin: Skin is warm, dry and intact. Capillary refill takes less than 2 seconds. No rash noted. She is not diaphoretic. No erythema. No pallor.  Psychiatric: She has a normal mood and affect. Her speech is normal and behavior is normal. Judgment and thought content normal. Cognition and memory are normal.  Nursing note and vitals reviewed.   Results for orders placed or performed in visit on 04/22/18  HM DIABETES EYE EXAM  Result Value Ref Range   HM Diabetic Eye Exam No Retinopathy No Retinopathy      Assessment & Plan:   Problem List Items Addressed This Visit      Endocrine   Diabetes mellitus type 2, uncomplicated (HCC) - Primary    Doing worse with A1c of 8.9, up from 8.4. Will increase her metformin to 1000mg  BID and recheck 3 months.       Relevant Medications   metFORMIN (GLUCOPHAGE) 500 MG tablet   Other Relevant Orders   Bayer DCA Hb A1c Waived    Other Visit Diagnoses    Immunization due       Pneumovax given today.  Relevant Orders   Pneumococcal polysaccharide vaccine 23-valent greater than or equal to 2yo subcutaneous/IM       Follow up plan: Return in about 3 months (around 08/14/2018) for 6 month follow up.

## 2018-05-15 NOTE — Patient Instructions (Signed)
Pneumococcal Polysaccharide Vaccine: What You Need to Know 1. Why get vaccinated? Vaccination can protect older adults (and some children and younger adults) from pneumococcal disease. Pneumococcal disease is caused by bacteria that can spread from person to person through close contact. It can cause ear infections, and it can also lead to more serious infections of the:  Lungs (pneumonia),  Blood (bacteremia), and  Covering of the brain and spinal cord (meningitis). Meningitis can cause deafness and brain damage, and it can be fatal.  Anyone can get pneumococcal disease, but children under 2 years of age, people with certain medical conditions, adults over 65 years of age, and cigarette smokers are at the highest risk. About 18,000 older adults die each year from pneumococcal disease in the [place]. Treatment of pneumococcal infections with penicillin and other drugs used to be more effective. But some strains of the disease have become resistant to these drugs. This makes prevention of the disease, through vaccination, even more important. 2. Pneumococcal polysaccharide vaccine (PPSV23) Pneumococcal polysaccharide vaccine (PPSV23) protects against 23 types of pneumococcal bacteria. It will not prevent all pneumococcal disease. PPSV23 is recommended for:  All adults 65 years of age and older,  Anyone 2 through 37 years of age with certain long-term health problems,  Anyone 2 through 37 years of age with a weakened immune system,  Adults 19 through 37 years of age who smoke cigarettes or have asthma.  Most people need only one dose of PPSV. A second dose is recommended for certain high-risk groups. People 65 and older should get a dose even if they have gotten one or more doses of the vaccine before they turned 65. Your healthcare provider can give you more information about these recommendations. Most healthy adults develop protection within 2 to 3 weeks of getting the shot. 3.  Some people should not get this vaccine  Anyone who has had a life-threatening allergic reaction to PPSV should not get another dose.  Anyone who has a severe allergy to any component of PPSV should not receive it. Tell your provider if you have any severe allergies.  Anyone who is moderately or severely ill when the shot is scheduled may be asked to wait until they recover before getting the vaccine. Someone with a mild illness can usually be vaccinated.  Children less than 2 years of age should not receive this vaccine.  There is no evidence that PPSV is harmful to either a pregnant woman or to her fetus. However, as a precaution, women who need the vaccine should be vaccinated before becoming pregnant, if possible. 4. Risks of a vaccine reaction With any medicine, including vaccines, there is a chance of side effects. These are usually mild and go away on their own, but serious reactions are also possible. About half of people who get PPSV have mild side effects, such as redness or pain where the shot is given, which go away within about two days. Less than 1 out of 100 people develop a fever, muscle aches, or more severe local reactions. Problems that could happen after any vaccine:  People sometimes faint after a medical procedure, including vaccination. Sitting or lying down for about 15 minutes can help prevent fainting, and injuries caused by a fall. Tell your doctor if you feel dizzy, or have vision changes or ringing in the ears.  Some people get severe pain in the shoulder and have difficulty moving the arm where a shot was given. This happens very rarely.  Any   medication can cause a severe allergic reaction. Such reactions from a vaccine are very rare, estimated at about 1 in a million doses, and would happen within a few minutes to a few hours after the vaccination. As with any medicine, there is a very remote chance of a vaccine causing a serious injury or death. The safety of  vaccines is always being monitored. For more information, visit: www.cdc.gov/vaccinesafety/ 5. What if there is a serious reaction? What should I look for? Look for anything that concerns you, such as signs of a severe allergic reaction, very high fever, or unusual behavior. Signs of a severe allergic reaction can include hives, swelling of the face and throat, difficulty breathing, a fast heartbeat, dizziness, and weakness. These would usually start a few minutes to a few hours after the vaccination. What should I do? If you think it is a severe allergic reaction or other emergency that can't wait, call 9-1-1 or get to the nearest hospital. Otherwise, call your doctor. Afterward, the reaction should be reported to the Vaccine Adverse Event Reporting System (VAERS). Your doctor might file this report, or you can do it yourself through the VAERS web site at www.vaers.hhs.gov, or by calling 1-800-822-7967. VAERS does not give medical advice. 6. How can I learn more?  Ask your doctor. He or she can give you the vaccine package insert or suggest other sources of information.  Call your local or state health department.  Contact the Centers for Disease Control and Prevention (CDC): ? Call 1-800-232-4636 (1-800-CDC-INFO) or ? Visit CDC's website at www.cdc.gov/vaccines CDC Pneumococcal Polysaccharide Vaccine VIS (12/18/13) This information is not intended to replace advice given to you by your health care provider. Make sure you discuss any questions you have with your health care provider. Document Released: 06/10/2006 Document Revised: 05/03/2016 Document Reviewed: 05/03/2016 Elsevier Interactive Patient Education  2017 [employer] Influenza (Flu) Vaccine (Inactivated or Recombinant): What You Need to Know 1. Why get vaccinated? Influenza ("flu") is a contagious disease that spreads around the [place] every year, usually between October and May. Flu is caused by influenza viruses, and is  spread mainly by coughing, sneezing, and close contact. Anyone can get flu. Flu strikes suddenly and can last several days. Symptoms vary by age, but can include:  fever/chills  sore throat  muscle aches  fatigue  cough  headache  runny or stuffy nose  Flu can also lead to pneumonia and blood infections, and cause diarrhea and seizures in children. If you have a medical condition, such as heart or lung disease, flu can make it worse. Flu is more dangerous for some people. Infants and young children, people 65 years of age and older, pregnant women, and people with certain health conditions or a weakened immune system are at greatest risk. Each year thousands of people in the [place] die from flu, and many more are hospitalized. Flu vaccine can:  keep you from getting flu,  make flu less severe if you do get it, and  keep you from spreading flu to your family and other people. 2. Inactivated and recombinant flu vaccines A dose of flu vaccine is recommended every flu season. Children 6 months through 8 years of age may need two doses during the same flu season. Everyone else needs only one dose each flu season. Some inactivated flu vaccines contain a very small amount of a mercury-based preservative called thimerosal. Studies have not shown thimerosal in vaccines to be harmful, but flu vaccines that do   not contain thimerosal are available. There is no live flu virus in flu shots. They cannot cause the flu. There are many flu viruses, and they are always changing. Each year a new flu vaccine is made to protect against three or four viruses that are likely to cause disease in the upcoming flu season. But even when the vaccine doesn't exactly match these viruses, it may still provide some protection. Flu vaccine cannot prevent:  flu that is caused by a virus not covered by the vaccine, or  illnesses that look like flu but are not.  It takes about 2 weeks for protection to  develop after vaccination, and protection lasts through the flu season. 3. Some people should not get this vaccine Tell the person who is giving you the vaccine:  If you have any severe, life-threatening allergies. If you ever had a life-threatening allergic reaction after a dose of flu vaccine, or have a severe allergy to any part of this vaccine, you may be advised not to get vaccinated. Most, but not all, types of flu vaccine contain a small amount of egg protein.  If you ever had Guillain-Barr Syndrome (also called GBS). Some people with a history of GBS should not get this vaccine. This should be discussed with your doctor.  If you are not feeling well. It is usually okay to get flu vaccine when you have a mild illness, but you might be asked to come back when you feel better.  4. Risks of a vaccine reaction With any medicine, including vaccines, there is a chance of reactions. These are usually mild and go away on their own, but serious reactions are also possible. Most people who get a flu shot do not have any problems with it. Minor problems following a flu shot include:  soreness, redness, or swelling where the shot was given  hoarseness  sore, red or itchy eyes  cough  fever  aches  headache  itching  fatigue  If these problems occur, they usually begin soon after the shot and last 1 or 2 days. More serious problems following a flu shot can include the following:  There may be a small increased risk of Guillain-Barre Syndrome (GBS) after inactivated flu vaccine. This risk has been estimated at 1 or 2 additional cases per million people vaccinated. This is much lower than the risk of severe complications from flu, which can be prevented by flu vaccine.  Young children who get the flu shot along with pneumococcal vaccine (PCV13) and/or DTaP vaccine at the same time might be slightly more likely to have a seizure caused by fever. Ask your doctor for more information.  Tell your doctor if a child who is getting flu vaccine has ever had a seizure.  Problems that could happen after any injected vaccine:  People sometimes faint after a medical procedure, including vaccination. Sitting or lying down for about 15 minutes can help prevent fainting, and injuries caused by a fall. Tell your doctor if you feel dizzy, or have vision changes or ringing in the ears.  Some people get severe pain in the shoulder and have difficulty moving the arm where a shot was given. This happens very rarely.  Any medication can cause a severe allergic reaction. Such reactions from a vaccine are very rare, estimated at about 1 in a million doses, and would happen within a few minutes to a few hours after the vaccination. As with any medicine, there is a very remote chance of a   vaccine causing a serious injury or death. The safety of vaccines is always being monitored. For more information, visit: www.cdc.gov/vaccinesafety/ 5. What if there is a serious reaction? What should I look for? Look for anything that concerns you, such as signs of a severe allergic reaction, very high fever, or unusual behavior. Signs of a severe allergic reaction can include hives, swelling of the face and throat, difficulty breathing, a fast heartbeat, dizziness, and weakness. These would start a few minutes to a few hours after the vaccination. What should I do?  If you think it is a severe allergic reaction or other emergency that can't wait, call 9-1-1 and get the person to the nearest hospital. Otherwise, call your doctor.  Reactions should be reported to the Vaccine Adverse Event Reporting System (VAERS). Your doctor should file this report, or you can do it yourself through the VAERS web site at www.vaers.hhs.gov, or by calling 1-800-822-7967. ? VAERS does not give medical advice. 6. The National Vaccine Injury Compensation Program The National Vaccine Injury Compensation Program (VICP) is a federal  program that was created to compensate people who may have been injured by certain vaccines. Persons who believe they may have been injured by a vaccine can learn about the program and about filing a claim by calling 1-800-338-2382 or visiting the VICP website at www.hrsa.gov/vaccinecompensation. There is a time limit to file a claim for compensation. 7. How can I learn more?  Ask your healthcare provider. He or she can give you the vaccine package insert or suggest other sources of information.  Call your local or state health department.  Contact the Centers for Disease Control and Prevention (CDC): ? Call 1-800-232-4636 (1-800-CDC-INFO) or ? Visit CDC's website at www.cdc.gov/flu Vaccine Information Statement, Inactivated Influenza Vaccine (04/02/2014) This information is not intended to replace advice given to you by your health care provider. Make sure you discuss any questions you have with your health care provider. Document Released: 06/07/2006 Document Revised: 05/03/2016 Document Reviewed: 05/03/2016 Elsevier Interactive Patient Education  2017 Elsevier Inc.  

## 2018-08-14 ENCOUNTER — Ambulatory Visit: Payer: BC Managed Care – PPO | Admitting: Family Medicine

## 2019-06-26 ENCOUNTER — Ambulatory Visit: Payer: BC Managed Care – PPO | Admitting: Family Medicine

## 2019-07-01 ENCOUNTER — Ambulatory Visit (INDEPENDENT_AMBULATORY_CARE_PROVIDER_SITE_OTHER): Payer: BC Managed Care – PPO | Admitting: Family Medicine

## 2019-07-01 ENCOUNTER — Other Ambulatory Visit: Payer: Self-pay

## 2019-07-01 ENCOUNTER — Encounter: Payer: Self-pay | Admitting: Family Medicine

## 2019-07-01 VITALS — Ht 63.0 in | Wt 267.0 lb

## 2019-07-01 DIAGNOSIS — G43109 Migraine with aura, not intractable, without status migrainosus: Secondary | ICD-10-CM

## 2019-07-01 DIAGNOSIS — G43909 Migraine, unspecified, not intractable, without status migrainosus: Secondary | ICD-10-CM | POA: Insufficient documentation

## 2019-07-01 DIAGNOSIS — E119 Type 2 diabetes mellitus without complications: Secondary | ICD-10-CM

## 2019-07-01 MED ORDER — NORTRIPTYLINE HCL 25 MG PO CAPS: 25.0000 mg | ORAL_CAPSULE | Freq: Every day | ORAL | 2 refills | Status: DC

## 2019-07-01 MED ORDER — METFORMIN HCL 500 MG PO TABS: 1000.0000 mg | ORAL_TABLET | Freq: Two times a day (BID) | ORAL | 1 refills | Status: DC

## 2019-07-01 NOTE — Assessment & Plan Note (Signed)
Not under good control. Will fill out FMLA for 2-3x a month and start her on nortriptyline to help prevent migraines. Call with any concerns. Recheck 1 month.

## 2019-07-01 NOTE — Progress Notes (Signed)
Ht 5\' 3"  (1.6 m)   Wt 267 lb (121.1 kg)   BMI 47.30 kg/m    Subjective:    Patient ID: Mary Drake, female    DOB: 1981/02/17, 38 y.o.   MRN: [id number]  HPI: Mary Drake is a 38 y.o. female who presents today after being lost to follow up for about a year.   Chief Complaint  Patient presents with  . Paperwork    FMLA   Notes that she has been having more migraines due to "wearing ppe" at work. She notes that this started about 5 months ago.  MIGRAINES Duration: 5 months Onset: sudden Severity: severe Quality: sharp and severe Frequency: 3x a week Location: in different places Headache duration: 102minutes to all day Radiation: no Time of day headache occurs: at random Alleviating factors: aleve Aggravating factors: wearing the PPE Headache status at time of visit: asymptomatic Treatments attempted: rest, ice, heat, APAP, ibuprofen and aleve", excedrine   Aura: yes Nausea:  yes Vomiting: no Photophobia:  yes Phonophobia:  yes Effect on social functioning:  yes Numbers of missed days of school/work each month: 2-3 Confusion:  no Gait disturbance/ataxia:  no Behavioral changes:  no Fevers:  no  DIABETES- taking her diabetes medicine. Has not checked her sugars.  Hypoglycemic episodes:no Polydipsia/polyuria: no Visual disturbance: no Chest pain: no Paresthesias: no Glucose Monitoring: no  Accucheck frequency: Not Checking Taking Insulin?: no Blood Pressure Monitoring: not checking Retinal Examination: Not up to Date Foot Exam: Not up to Date Diabetic Education: Not Completed Pneumovax: Up to Date Influenza: Not up to Date Aspirin: no   Relevant past medical, surgical, family and social history reviewed and updated as indicated. Interim medical history since our last visit reviewed. Allergies and medications reviewed and updated.  Review of Systems  Constitutional: Negative.   HENT: Negative.   Respiratory: Negative.    Gastrointestinal: Negative.   Neurological: Positive for headaches. Negative for dizziness, tremors, seizures, syncope, facial asymmetry, speech difficulty, weakness, light-headedness and numbness.  Hematological: Negative.   Psychiatric/Behavioral: Negative.     Per HPI unless specifically indicated above     Objective:    Ht 5\' 3"  (1.6 m)   Wt 267 lb (121.1 kg)   BMI 47.30 kg/m   Wt Readings from Last 3 Encounters:  07/01/19 267 lb (121.1 kg)  05/15/18 280 lb 3 oz (127.1 kg)  03/19/18 283 lb (128.4 kg)    Physical Exam Vitals signs and nursing note reviewed.  Constitutional:      General: She is not in acute distress.    Appearance: Normal appearance. She is not ill-appearing, toxic-appearing or diaphoretic.  HENT:     Head: Normocephalic and atraumatic.     Right Ear: External ear normal.     Left Ear: External ear normal.     Nose: Nose normal.     Mouth/Throat:     Mouth: Mucous membranes are moist.     Pharynx: Oropharynx is clear.  Eyes:     General: No scleral icterus.       Right eye: No discharge.        Left eye: No discharge.     Conjunctiva/sclera: Conjunctivae normal.     Pupils: Pupils are equal, round, and reactive to light.  Neck:     Musculoskeletal: Normal range of motion.  Pulmonary:     Effort: Pulmonary effort is normal. No respiratory distress.     Comments: Speaking in full sentences Musculoskeletal: Normal range of  motion.  Skin:    Coloration: Skin is not jaundiced or pale.     Findings: No bruising, erythema, lesion or rash.  Neurological:     Mental Status: She is alert and oriented to person, place, and time. Mental status is at baseline.  Psychiatric:        Mood and Affect: Mood normal.        Behavior: Behavior normal.        Thought Content: Thought content normal.        Judgment: Judgment normal.     Results for orders placed or performed in visit on 05/15/18  Bayer DCA Hb A1c Waived  Result Value Ref Range   HB A1C  (BAYER DCA - WAIVED) 8.9 (H) <7.0 %      Assessment & Plan:   Problem List Items Addressed This Visit      Cardiovascular and Mediastinum   Migraines    Not under good control. Will fill out FMLA for 2-3x a month and start her on nortriptyline to help prevent migraines. Call with any concerns. Recheck 1 month.       Relevant Medications   nortriptyline (PAMELOR) 25 MG capsule     Endocrine   Diabetes mellitus type 2, uncomplicated (HCC) - Primary    Overdue for labs. Will check them ASAP. Refill given today. Continue to monitor. Call with any concerns.       Relevant Medications   metFORMIN (GLUCOPHAGE) 500 MG tablet   Other Relevant Orders   Bayer DCA Hb A1c Waived   CBC with Differential/Platelet   Comprehensive metabolic panel   Lipid Panel w/o Chol/HDL Ratio   Microalbumin, Urine Waived   TSH   UA/M w/rflx Culture, Routine       Follow up plan: Return in about 4 weeks (around 07/29/2019) for Physical.   . This visit was completed via FaceTime due to the restrictions of the COVID-19 pandemic. All issues as above were discussed and addressed. Physical exam was done as above through visual confirmation on FaceTime. If it was felt that the patient should be evaluated in the office, they were directed there. The patient verbally consented to this visit. . Location of the patient: home . Location of the provider: home . Those involved with this call:  . Provider: Olevia Perches, DO . CMA: Wilhemena Durie, CMA . Front Desk/Registration: Adela Ports  . Time spent on call: 25 minutes with patient face to face via video conference. More than 50% of this time was spent in counseling and coordination of care. 40 minutes total spent in review of patient's record and preparation of their chart.

## 2019-07-01 NOTE — Assessment & Plan Note (Signed)
Overdue for labs. Will check them ASAP. Refill given today. Continue to monitor. Call with any concerns.

## 2019-07-30 ENCOUNTER — Other Ambulatory Visit: Payer: Self-pay | Admitting: Family Medicine

## 2019-07-30 NOTE — Telephone Encounter (Signed)
Routing to provider  

## 2019-07-30 NOTE — Telephone Encounter (Signed)
Requested medication (s) are due for refill today: yes  Requested medication (s) are on the active medication list: yes  Last refill:  07/01/2019  Future visit scheduled: yes  Notes to clinic: requesting a 90 day supply with refills   Requested Prescriptions  Pending Prescriptions Disp Refills   nortriptyline (PAMELOR) 25 MG capsule [Pharmacy Med Name: NORTRIPTYLINE HCL 25 MG CAP] 90 capsule 1    Sig: Take 1 capsule (25 mg total) by mouth at bedtime.     Psychiatry:  Antidepressants - Heterocyclics (TCAs) Failed - 07/30/2019  9:35 AM      Failed - Completed PHQ-2 or PHQ-9 in the last 360 days.      Passed - Valid encounter within last 6 months    Recent Outpatient Visits          4 weeks ago Type 2 diabetes mellitus without complication, without long-term current use of insulin ([place])   [place], Megan P, DO   1 year ago Type 2 diabetes mellitus without complication, without long-term current use of insulin ([place])   [place], Megan P, DO   1 year ago Routine general medical examination at a health care facility   [facility] [place] P, DO   1 year ago Type 2 diabetes mellitus without complication, without long-term current use of insulin ([place])   [place], [place], DO   2 years ago Dysfunction of both eustachian tubes   [facility] Valerie Roys, DO      Future Appointments            In 1 month Johnson, Barb Merino, DO [employer]

## 2019-09-01 ENCOUNTER — Encounter: Payer: Self-pay | Admitting: Family Medicine

## 2019-09-01 ENCOUNTER — Ambulatory Visit (INDEPENDENT_AMBULATORY_CARE_PROVIDER_SITE_OTHER): Payer: BC Managed Care – PPO | Admitting: Family Medicine

## 2019-09-01 ENCOUNTER — Other Ambulatory Visit (HOSPITAL_COMMUNITY)
Admission: RE | Admit: 2019-09-01 | Discharge: 2019-09-01 | Disposition: A | Discharge status: Home / Self Care | Payer: BC Managed Care – PPO | Source: Ambulatory Visit | Attending: Family Medicine | Admitting: Family Medicine

## 2019-09-01 ENCOUNTER — Other Ambulatory Visit: Payer: Self-pay

## 2019-09-01 VITALS — BP 134/83 | HR 69 | Temp 98.1°F | Ht 62.4 in | Wt 258.4 lb

## 2019-09-01 DIAGNOSIS — Z0000 Encounter for general adult medical examination without abnormal findings: Secondary | ICD-10-CM

## 2019-09-01 DIAGNOSIS — E119 Type 2 diabetes mellitus without complications: Secondary | ICD-10-CM

## 2019-09-01 DIAGNOSIS — Z23 Encounter for immunization: Secondary | ICD-10-CM

## 2019-09-01 DIAGNOSIS — G43109 Migraine with aura, not intractable, without status migrainosus: Secondary | ICD-10-CM | POA: Diagnosis not present

## 2019-09-01 DIAGNOSIS — Z124 Encounter for screening for malignant neoplasm of cervix: Secondary | ICD-10-CM | POA: Diagnosis present

## 2019-09-01 LAB — UA/M W/RFLX CULTURE, ROUTINE
Bilirubin, UA: NEGATIVE
Ketones, UA: NEGATIVE
Leukocytes,UA: NEGATIVE
Nitrite, UA: NEGATIVE
Protein,UA: NEGATIVE
RBC, UA: NEGATIVE
Specific Gravity, UA: 1.02 (ref 1.005–1.030)
Urobilinogen, Ur: 1 mg/dL (ref 0.2–1.0)
pH, UA: 6 (ref 5.0–7.5)

## 2019-09-01 LAB — BAYER DCA HB A1C WAIVED: HB A1C (BAYER DCA - WAIVED): 10.2 % — ABNORMAL HIGH (ref <7.0)

## 2019-09-01 LAB — MICROALBUMIN, URINE WAIVED
Creatinine, Urine Waived: 100 mg/dL (ref 10–300)
Microalb, Ur Waived: 30 mg/L — ABNORMAL HIGH (ref 0–19)
Microalb/Creat Ratio: <30 mg/g (ref <30)

## 2019-09-01 MED ORDER — NORTRIPTYLINE HCL 25 MG PO CAPS: 25.0000 mg | ORAL_CAPSULE | Freq: Every day | ORAL | 1 refills | Status: DC

## 2019-09-01 MED ORDER — METFORMIN HCL 500 MG PO TABS: 1000.0000 mg | ORAL_TABLET | Freq: Two times a day (BID) | ORAL | 1 refills | Status: DC

## 2019-09-01 MED ORDER — EMPAGLIFLOZIN 25 MG PO TABS: 25.0000 mg | ORAL_TABLET | Freq: Every day | ORAL | 3 refills | Status: DC

## 2019-09-01 NOTE — Patient Instructions (Signed)
Health Maintenance, Female Adopting a healthy lifestyle and getting preventive care are important in promoting health and wellness. Ask your health care provider about:  The right schedule for you to have regular tests and exams.  Things you can do on your own to prevent diseases and keep yourself healthy. What should I know about diet, weight, and exercise? Eat a healthy diet   Eat a diet that includes plenty of vegetables, fruits, low-fat dairy products, and lean protein.  Do not eat a lot of foods that are high in solid fats, added sugars, or sodium. Maintain a healthy weight Body mass index (BMI) is used to identify weight problems. It estimates body fat based on height and weight. Your health care provider can help determine your BMI and help you achieve or maintain a healthy weight. Get regular exercise Get regular exercise. This is one of the most important things you can do for your health. Most adults should:  Exercise for at least 150 minutes each week. The exercise should increase your heart rate and make you sweat (moderate-intensity exercise).  Do strengthening exercises at least twice a week. This is in addition to the moderate-intensity exercise.  Spend less time sitting. Even light physical activity can be beneficial. Watch cholesterol and blood lipids Have your blood tested for lipids and cholesterol at 39 years of age, then have this test every 5 years. Have your cholesterol levels checked more often if:  Your lipid or cholesterol levels are high.  You are older than 40 years of age.  You are at high risk for heart disease. What should I know about cancer screening? Depending on your health history and family history, you may need to have cancer screening at various ages. This may include screening for:  Breast cancer.  Cervical cancer.  Colorectal cancer.  Skin cancer.  Lung cancer. What should I know about heart disease, diabetes, and high blood  pressure? Blood pressure and heart disease  High blood pressure causes heart disease and increases the risk of stroke. This is more likely to develop in people who have high blood pressure readings, are of African descent, or are overweight.  Have your blood pressure checked: ? Every 3-5 years if you are 18-39 years of age. ? Every year if you are 40 years old or older. Diabetes Have regular diabetes screenings. This checks your fasting blood sugar level. Have the screening done:  Once every three years after age 40 if you are at a normal weight and have a low risk for diabetes.  More often and at a younger age if you are overweight or have a high risk for diabetes. What should I know about preventing infection? Hepatitis B If you have a higher risk for hepatitis B, you should be screened for this virus. Talk with your health care provider to find out if you are at risk for hepatitis B infection. Hepatitis C Testing is recommended for:  Everyone born from 1945 through 1965.  Anyone with known risk factors for hepatitis C. Sexually transmitted infections (STIs)  Get screened for STIs, including gonorrhea and chlamydia, if: ? You are sexually active and are younger than 39 years of age. ? You are older than 39 years of age and your health care provider tells you that you are at risk for this type of infection. ? Your sexual activity has changed since you were last screened, and you are at increased risk for chlamydia or gonorrhea. Ask your health care provider if   you are at risk.  Ask your health care provider about whether you are at high risk for HIV. Your health care provider may recommend a prescription medicine to help prevent HIV infection. If you choose to take medicine to prevent HIV, you should first get tested for HIV. You should then be tested every 3 months for as long as you are taking the medicine. Pregnancy  If you are about to stop having your period (premenopausal) and  you may become pregnant, seek counseling before you get pregnant.  Take 400 to 800 micrograms (mcg) of folic acid every day if you become pregnant.  Ask for birth control (contraception) if you want to prevent pregnancy. Osteoporosis and menopause Osteoporosis is a disease in which the bones lose minerals and strength with aging. This can result in bone fractures. If you are 65 years old or older, or if you are at risk for osteoporosis and fractures, ask your health care provider if you should:  Be screened for bone loss.  Take a calcium or vitamin D supplement to lower your risk of fractures.  Be given hormone replacement therapy (HRT) to treat symptoms of menopause. Follow these instructions at home: Lifestyle  Do not use any products that contain nicotine or tobacco, such as cigarettes, e-cigarettes, and chewing tobacco. If you need help quitting, ask your health care provider.  Do not use street drugs.  Do not share needles.  Ask your health care provider for help if you need support or information about quitting drugs. Alcohol use  Do not drink alcohol if: ? Your health care provider tells you not to drink. ? You are pregnant, may be pregnant, or are planning to become pregnant.  If you drink alcohol: ? Limit how much you use to 0-1 drink a day. ? Limit intake if you are breastfeeding.  Be aware of how much alcohol is in your drink. In the [place] one drink equals one 12 oz bottle of beer (355 mL), one 5 oz glass of wine (148 mL), or one 1 oz glass of hard liquor (44 mL). General instructions  Schedule regular health, dental, and eye exams.  Stay current with your vaccines.  Tell your health care provider if: ? You often feel depressed. ? You have ever been abused or do not feel safe at home. Summary  Adopting a healthy lifestyle and getting preventive care are important in promoting health and wellness.  Follow your health care provider's instructions about healthy  diet, exercising, and getting tested or screened for diseases.  Follow your health care provider's instructions on monitoring your cholesterol and blood pressure. This information is not intended to replace advice given to you by your health care provider. Make sure you discuss any questions you have with your health care provider. Document Revised: 08/06/2018 Document Reviewed: 08/06/2018 Elsevier Patient Education  2020 [employer]  

## 2019-09-01 NOTE — Assessment & Plan Note (Signed)
Doing well on the nortriptyline. Only 1 migraine in 2 months. Call with any concerns. Continue to monitor.

## 2019-09-01 NOTE — Assessment & Plan Note (Signed)
Not under good control with A1c of 10.2. Will continue metformin and add jardiance and recheck 1 month for tolerance. Call with any concerns.

## 2019-09-01 NOTE — Progress Notes (Signed)
BP 134/83   Pulse 69   Temp 98.1 F (36.7 C)   Ht 5' 2.4" (1.585 m)   Wt 258 lb 6 oz (117.2 kg)   SpO2 97%   BMI 46.65 kg/m    Subjective:    Patient ID: Mary Drake, female    DOB: 1981/01/10, 39 y.o.   MRN: [id number]  HPI: Mary Drake is a 39 y.o. female presenting on 09/01/2019 for comprehensive medical examination. Current medical complaints include:  Started on nortriptyline for her migraines- has done very well. Only 1 migraine in the last 2 months.   DIABETES Hypoglycemic episodes:no Polydipsia/polyuria: no Visual disturbance: no Chest pain: no Paresthesias: no Glucose Monitoring: no  Accucheck frequency: Not Checking Taking Insulin?: no Blood Pressure Monitoring: not checking Retinal Examination: Not up to Date Foot Exam: Done today Diabetic Education: Completed Pneumovax: Up to Date Influenza: Up to Date Aspirin: no  Menopausal Symptoms: no  Depression Screen done today and results listed below:  Depression screen [facility] 2/9 09/01/2019 01/31/2018 11/29/2016  Decreased Interest 0 2 0  Down, Depressed, Hopeless 0 0 0  PHQ - 2 Score 0 2 0  Altered sleeping - 2 -  Tired, decreased energy - 2 -  Change in appetite - 2 -  Feeling bad or failure about yourself  - 0 -  Trouble concentrating - 0 -  Moving slowly or fidgety/restless - 0 -  Suicidal thoughts - 0 -  PHQ-9 Score - 8 -  Difficult doing work/chores - Not difficult at all -    Past Medical History:  Past Medical History:  Diagnosis Date  . Migraines   . Psoriasis     Surgical History:  Past Surgical History:  Procedure Laterality Date  . CHOLECYSTECTOMY  2010    Medications:  Current Outpatient Medications on File Prior to Visit  Medication Sig  . cetirizine (ZYRTEC) 10 MG tablet Take 10 mg by mouth daily.  . folic acid (FOLVITE) 656 MCG tablet Take 400 mcg by mouth daily.  . methotrexate (RHEUMATREX) 2.5 MG tablet Take 20 mg by mouth once a week. 6 tablets weekly   . TALTZ  80 MG/ML SOAJ    No current facility-administered medications on file prior to visit.    Allergies:  Allergies  Allergen Reactions  . Codeine     Social History:  Social History   Socioeconomic History  . Marital status: Single    Spouse name: Not on file  . Number of children: Not on file  . Years of education: Not on file  . Highest education level: Not on file  Occupational History  . Not on file  Tobacco Use  . Smoking status: Never Smoker  . Smokeless tobacco: Never Used  Substance and Sexual Activity  . Alcohol use: Yes    Comment: 0n occasion  . Drug use: No  . Sexual activity: Yes    Birth control/protection: None  Other Topics Concern  . Not on file  Social History Narrative  . Not on file   Social Determinants of Health   Financial Resource Strain:   . Difficulty of Paying Living Expenses: Not on file  Food Insecurity:   . Worried About Charity fundraiser in the Last Year: Not on file  . Ran Out of Food in the Last Year: Not on file  Transportation Needs:   . Lack of Transportation (Medical): Not on file  . Lack of Transportation (Non-Medical): Not on file  Physical Activity:   .  Days of Exercise per Week: Not on file  . Minutes of Exercise per Session: Not on file  Stress:   . Feeling of Stress : Not on file  Social Connections:   . Frequency of Communication with Friends and Family: Not on file  . Frequency of Social Gatherings with Friends and Family: Not on file  . Attends Religious Services: Not on file  . Active Member of Clubs or Organizations: Not on file  . Attends Archivist Meetings: Not on file  . Marital Status: Not on file  Intimate Partner Violence:   . Fear of Current or Ex-Partner: Not on file  . Emotionally Abused: Not on file  . Physically Abused: Not on file  . Sexually Abused: Not on file   Social History   Tobacco Use  Smoking Status Never Smoker  Smokeless Tobacco Never Used   Social History    Substance and Sexual Activity  Alcohol Use Yes   Comment: 0n occasion    Family History:  Family History  Problem Relation Age of Onset  . Diabetes Mother   . Hypertension Mother   . Diabetes Father   . Heart disease Father   . Heart failure Father     Past medical history, surgical history, medications, allergies, family history and social history reviewed with patient today and changes made to appropriate areas of the chart.   Review of Systems  Constitutional: Negative.   HENT: Positive for tinnitus. Negative for congestion, ear discharge, ear pain, hearing loss, nosebleeds, sinus pain and sore throat.   Eyes: Negative.   Respiratory: Negative.  Negative for stridor.   Cardiovascular: Negative.   Gastrointestinal: Positive for diarrhea (with food choices). Negative for abdominal pain, blood in stool, constipation, heartburn, melena, nausea and vomiting.  Genitourinary: Negative.   Musculoskeletal: Positive for myalgias. Negative for back pain, falls, joint pain and neck pain.  Skin: Negative.   Neurological: Negative.   Endo/Heme/Allergies: Positive for environmental allergies. Negative for polydipsia. Does not bruise/bleed easily.  Psychiatric/Behavioral: Negative.     All other ROS negative except what is listed above and in the HPI.      Objective:    BP 134/83   Pulse 69   Temp 98.1 F (36.7 C)   Ht 5' 2.4" (1.585 m)   Wt 258 lb 6 oz (117.2 kg)   SpO2 97%   BMI 46.65 kg/m   Wt Readings from Last 3 Encounters:  09/01/19 258 lb 6 oz (117.2 kg)  07/01/19 267 lb (121.1 kg)  05/15/18 280 lb 3 oz (127.1 kg)    Physical Exam Vitals and nursing note reviewed. Exam conducted with a chaperone present.  Constitutional:      General: She is not in acute distress.    Appearance: Normal appearance. She is not ill-appearing, toxic-appearing or diaphoretic.  HENT:     Head: Normocephalic and atraumatic.     Right Ear: Tympanic membrane, ear canal and external ear  normal. There is no impacted cerumen.     Left Ear: Tympanic membrane, ear canal and external ear normal. There is no impacted cerumen.     Nose: Nose normal. No congestion or rhinorrhea.     Mouth/Throat:     Mouth: Mucous membranes are moist.     Pharynx: Oropharynx is clear. No oropharyngeal exudate or posterior oropharyngeal erythema.  Eyes:     General: No scleral icterus.       Right eye: No discharge.  Left eye: No discharge.     Extraocular Movements: Extraocular movements intact.     Conjunctiva/sclera: Conjunctivae normal.     Pupils: Pupils are equal, round, and reactive to light.  Neck:     Vascular: No carotid bruit.  Cardiovascular:     Rate and Rhythm: Normal rate and regular rhythm.     Pulses: Normal pulses.     Heart sounds: No murmur. No friction rub. No gallop.   Pulmonary:     Effort: Pulmonary effort is normal. No respiratory distress.     Breath sounds: Normal breath sounds. No stridor. No wheezing, rhonchi or rales.  Chest:     Chest wall: No tenderness.     Breasts:        Right: Normal. No swelling, bleeding, inverted nipple, mass, nipple discharge, skin change or tenderness.        Left: Normal. No swelling, bleeding, inverted nipple, mass, nipple discharge, skin change or tenderness.  Abdominal:     General: Abdomen is flat. Bowel sounds are normal. There is no distension.     Palpations: Abdomen is soft. There is no mass.     Tenderness: There is no abdominal tenderness. There is no right CVA tenderness, left CVA tenderness, guarding or rebound.     Hernia: No hernia is present.  Genitourinary:    Labia:        Right: No rash, tenderness, lesion or injury.        Left: No rash, tenderness, lesion or injury.      Urethra: No prolapse, urethral pain, urethral swelling or urethral lesion.     Vagina: Normal. No signs of injury and foreign body. No vaginal discharge, erythema, tenderness, bleeding, lesions or prolapsed vaginal walls.     Cervix:  Discharge present. No cervical motion tenderness, friability, lesion, erythema, cervical bleeding or eversion.     Uterus: Normal. Not deviated, not enlarged, not fixed, not tender and no uterine prolapse.   Musculoskeletal:        General: No swelling, tenderness, deformity or signs of injury.     Cervical back: Normal range of motion and neck supple. No rigidity. No muscular tenderness.     Right lower leg: No edema.     Left lower leg: No edema.  Lymphadenopathy:     Cervical: No cervical adenopathy.     Upper Body:     Right upper body: No supraclavicular, axillary or pectoral adenopathy.     Left upper body: No supraclavicular, axillary or pectoral adenopathy.  Skin:    General: Skin is warm and dry.     Capillary Refill: Capillary refill takes less than 2 seconds.     Coloration: Skin is not jaundiced or pale.     Findings: No bruising, erythema, lesion or rash.  Neurological:     General: No focal deficit present.     Mental Status: She is alert and oriented to person, place, and time. Mental status is at baseline.     Cranial Nerves: No cranial nerve deficit.     Sensory: No sensory deficit.     Motor: No weakness.     Coordination: Coordination normal.     Gait: Gait normal.     Deep Tendon Reflexes: Reflexes normal.  Psychiatric:        Mood and Affect: Mood normal.        Behavior: Behavior normal.        Thought Content: Thought content normal.  Judgment: Judgment normal.     Results for orders placed or performed in visit on 05/15/18  Bayer DCA Hb A1c Waived  Result Value Ref Range   HB A1C (BAYER DCA - WAIVED) 8.9 (H) <7.0 %      Assessment & Plan:   Problem List Items Addressed This Visit      Cardiovascular and Mediastinum   Migraines    Doing well on the nortriptyline. Only 1 migraine in 2 months. Call with any concerns. Continue to monitor.      Relevant Medications   nortriptyline (PAMELOR) 25 MG capsule     Endocrine   Diabetes mellitus  type 2, uncomplicated ([place])    Not under good control with A1c of 10.2. Will continue metformin and add jardiance and recheck 1 month for tolerance. Call with any concerns.       Relevant Medications   empagliflozin (JARDIANCE) 25 MG TABS tablet   metFORMIN (GLUCOPHAGE) 500 MG tablet   Other Relevant Orders   Bayer DCA Hb A1c Waived   Comp Met (CMET)   Lipid Panel w/o Chol/HDL Ratio OUT   Microalbumin, Urine Waived   UA/M w/rflx Culture, Routine    Other Visit Diagnoses    Routine general medical examination at a health care facility    -  Primary   Relevant Orders   Bayer DCA Hb A1c Waived   CBC with Differential OUT   Comp Met (CMET)   Lipid Panel w/o Chol/HDL Ratio OUT   Microalbumin, Urine Waived   TSH   UA/M w/rflx Culture, Routine   Screening for cervical cancer       Pap done today. Await results.    Relevant Orders   Pap over 30   Immunization due       Tdap given today. Call with any concerns.    Relevant Orders   Tdap vaccine greater than or equal to 7yo IM       Follow up plan: Return in about 4 weeks (around 09/29/2019) for check on jardiance tolerance.   LABORATORY TESTING:  - Pap smear: pap done  IMMUNIZATIONS:   - Tdap: Tetanus vaccination status reviewed: Tdap vaccination indicated and given today. - Influenza: Up to date - Pneumovax: Up to date - HPV: Refused  PATIENT COUNSELING:   Advised to take 1 mg of folate supplement per day if capable of pregnancy.   Sexuality: Discussed sexually transmitted diseases, partner selection, use of condoms, avoidance of unintended pregnancy  and contraceptive alternatives.   Advised to avoid cigarette smoking.  I discussed with the patient that most people either abstain from alcohol or drink within safe limits (<=14/week and <=4 drinks/occasion for males, <=7/weeks and <= 3 drinks/occasion for females) and that the risk for alcohol disorders and other health effects rises proportionally with the number of  drinks per week and how often a drinker exceeds daily limits.  Discussed cessation/primary prevention of drug use and availability of treatment for abuse.   Diet: Encouraged to adjust caloric intake to maintain  or achieve ideal body weight, to reduce intake of dietary saturated fat and total fat, to limit sodium intake by avoiding high sodium foods and not adding table salt, and to maintain adequate dietary potassium and calcium preferably from fresh fruits, vegetables, and low-fat dairy products.    stressed the importance of regular exercise  Injury prevention: Discussed safety belts, safety helmets, smoke detector, smoking near bedding or upholstery.   Dental health: Discussed importance of regular tooth brushing,  flossing, and dental visits.    NEXT PREVENTATIVE PHYSICAL DUE IN 1 YEAR. Return in about 4 weeks (around 09/29/2019) for check on jardiance tolerance.

## 2019-09-02 LAB — CBC WITH DIFFERENTIAL/PLATELET
Basophils Absolute: 0.1 10*3/uL (ref 0.0–0.2)
Basos: 1 %
EOS (ABSOLUTE): 0.2 10*3/uL (ref 0.0–0.4)
Eos: 3 %
Hematocrit: 47.5 % — ABNORMAL HIGH (ref 34.0–46.6)
Hemoglobin: 15.3 g/dL (ref 11.1–15.9)
Immature Grans (Abs): 0 10*3/uL (ref 0.0–0.1)
Immature Granulocytes: 0 %
Lymphocytes Absolute: 2.2 10*3/uL (ref 0.7–3.1)
Lymphs: 32 %
MCH: 29.5 pg (ref 26.6–33.0)
MCHC: 32.2 g/dL (ref 31.5–35.7)
MCV: 92 fL (ref 79–97)
Monocytes Absolute: 0.5 10*3/uL (ref 0.1–0.9)
Monocytes: 7 %
Neutrophils Absolute: 4 10*3/uL (ref 1.4–7.0)
Neutrophils: 57 %
Platelets: 348 10*3/uL (ref 150–450)
RBC: 5.19 x10E6/uL (ref 3.77–5.28)
RDW: 12.7 % (ref 11.7–15.4)
WBC: 7 10*3/uL (ref 3.4–10.8)

## 2019-09-02 LAB — COMPREHENSIVE METABOLIC PANEL
ALT: 38 IU/L — ABNORMAL HIGH (ref 0–32)
AST: 23 IU/L (ref 0–40)
Albumin/Globulin Ratio: 1.4 (ref 1.2–2.2)
Albumin: 4.6 g/dL (ref 3.8–4.8)
Alkaline Phosphatase: 62 IU/L (ref 39–117)
BUN/Creatinine Ratio: 14 (ref 9–23)
BUN: 11 mg/dL (ref 6–20)
Bilirubin Total: 0.4 mg/dL (ref 0.0–1.2)
CO2: 27 mmol/L (ref 20–29)
Calcium: 9.4 mg/dL (ref 8.7–10.2)
Chloride: 97 mmol/L (ref 96–106)
Creatinine, Ser: 0.8 mg/dL (ref 0.57–1.00)
GFR calc Af Amer: 108 mL/min/{1.73_m2} (ref >59)
GFR calc non Af Amer: 94 mL/min/{1.73_m2} (ref >59)
Globulin, Total: 3.2 g/dL (ref 1.5–4.5)
Glucose: 244 mg/dL — ABNORMAL HIGH (ref 65–99)
Potassium: 4.6 mmol/L (ref 3.5–5.2)
Sodium: 136 mmol/L (ref 134–144)
Total Protein: 7.8 g/dL (ref 6.0–8.5)

## 2019-09-02 LAB — TSH: TSH: 1.21 u[IU]/mL (ref 0.450–4.500)

## 2019-09-02 LAB — LIPID PANEL W/O CHOL/HDL RATIO
Cholesterol, Total: 177 mg/dL (ref 100–199)
HDL: 35 mg/dL — ABNORMAL LOW (ref >39)
LDL Chol Calc (NIH): 113 mg/dL — ABNORMAL HIGH (ref 0–99)
Triglycerides: 166 mg/dL — ABNORMAL HIGH (ref 0–149)
VLDL Cholesterol Cal: 29 mg/dL (ref 5–40)

## 2019-09-03 LAB — CYTOLOGY - PAP
Comment: NEGATIVE
Diagnosis: NEGATIVE
High risk HPV: NEGATIVE

## 2019-09-28 ENCOUNTER — Encounter: Payer: Self-pay | Admitting: Family Medicine

## 2019-09-30 ENCOUNTER — Encounter: Payer: Self-pay | Admitting: Family Medicine

## 2019-09-30 ENCOUNTER — Ambulatory Visit (INDEPENDENT_AMBULATORY_CARE_PROVIDER_SITE_OTHER): Payer: BC Managed Care – PPO | Admitting: Family Medicine

## 2019-09-30 ENCOUNTER — Other Ambulatory Visit: Payer: Self-pay

## 2019-09-30 VITALS — BP 118/82 | HR 78 | Temp 98.6°F | Ht 62.4 in | Wt 257.0 lb

## 2019-09-30 DIAGNOSIS — B373 Candidiasis of vulva and vagina: Secondary | ICD-10-CM | POA: Diagnosis not present

## 2019-09-30 DIAGNOSIS — N760 Acute vaginitis: Secondary | ICD-10-CM

## 2019-09-30 DIAGNOSIS — B9689 Other specified bacterial agents as the cause of diseases classified elsewhere: Secondary | ICD-10-CM

## 2019-09-30 DIAGNOSIS — Z23 Encounter for immunization: Secondary | ICD-10-CM

## 2019-09-30 DIAGNOSIS — B3731 Acute candidiasis of vulva and vagina: Secondary | ICD-10-CM

## 2019-09-30 LAB — WET PREP FOR TRICH, YEAST, CLUE
Clue Cell Exam: POSITIVE — AB
Trichomonas Exam: NEGATIVE
Yeast Exam: POSITIVE — AB

## 2019-09-30 LAB — UA/M W/RFLX CULTURE, ROUTINE
Bilirubin, UA: NEGATIVE
Leukocytes,UA: NEGATIVE
Nitrite, UA: NEGATIVE
Protein,UA: NEGATIVE
RBC, UA: NEGATIVE
Specific Gravity, UA: 1.015 (ref 1.005–1.030)
Urobilinogen, Ur: 0.2 mg/dL (ref 0.2–1.0)
pH, UA: 5 (ref 5.0–7.5)

## 2019-09-30 MED ORDER — METRONIDAZOLE 500 MG PO TABS: 500.0000 mg | ORAL_TABLET | Freq: Two times a day (BID) | ORAL | 0 refills | Status: DC

## 2019-09-30 MED ORDER — FLUCONAZOLE 150 MG PO TABS: 150.0000 mg | ORAL_TABLET | ORAL | 0 refills | Status: DC

## 2019-09-30 NOTE — Progress Notes (Signed)
BP 118/82   Pulse 78   Temp 98.6 F (37 C) (Oral)   Ht 5' 2.4" (1.585 m)   Wt 257 lb (116.6 kg)   SpO2 97%   BMI 46.41 kg/m    Subjective:    Patient ID: Mary Drake, female    DOB: May 25, 1981, 39 y.o.   MRN: [id number]  HPI: VALITA RIGHTER is a 39 y.o. female  Chief Complaint  Patient presents with  . Vaginal Itching    burning sensation since last weekend  . Dysuria   Vaginal itching and irritation, dysuria and frequency x 1.5 weeks. Has not tried anything OTC for sxs. No abdominal pain, hematuria, fevers, chills, rashes.   Relevant past medical, surgical, family and social history reviewed and updated as indicated. Interim medical history since our last visit reviewed. Allergies and medications reviewed and updated.  Review of Systems  Per HPI unless specifically indicated above     Objective:    BP 118/82   Pulse 78   Temp 98.6 F (37 C) (Oral)   Ht 5' 2.4" (1.585 m)   Wt 257 lb (116.6 kg)   SpO2 97%   BMI 46.41 kg/m   Wt Readings from Last 3 Encounters:  09/30/19 257 lb (116.6 kg)  09/01/19 258 lb 6 oz (117.2 kg)  07/01/19 267 lb (121.1 kg)    Physical Exam Vitals and nursing note reviewed.  Constitutional:      Appearance: Normal appearance. She is not ill-appearing.  HENT:     Head: Atraumatic.  Eyes:     Extraocular Movements: Extraocular movements intact.     Conjunctiva/sclera: Conjunctivae normal.  Cardiovascular:     Rate and Rhythm: Normal rate and regular rhythm.     Heart sounds: Normal heart sounds.  Pulmonary:     Effort: Pulmonary effort is normal.     Breath sounds: Normal breath sounds.  Abdominal:     Tenderness: There is no abdominal tenderness. There is no right CVA tenderness, left CVA tenderness or guarding.  Genitourinary:    Vagina: Vaginal discharge present.  Musculoskeletal:        General: Normal range of motion.     Cervical back: Normal range of motion and neck supple.  Skin:    General: Skin is  warm and dry.  Neurological:     Mental Status: She is alert and oriented to person, place, and time.  Psychiatric:        Mood and Affect: Mood normal.        Thought Content: Thought content normal.        Judgment: Judgment normal.     Results for orders placed or performed in visit on 09/01/19  Bayer DCA Hb A1c Waived  Result Value Ref Range   HB A1C (BAYER DCA - WAIVED) 10.2 (H) <7.0 %  CBC with Differential OUT  Result Value Ref Range   WBC 7.0 3.4 - 10.8 x10E3/uL   RBC 5.19 3.77 - 5.28 x10E6/uL   Hemoglobin 15.3 11.1 - 15.9 g/dL   Hematocrit 47.5 (H) 34.0 - 46.6 %   MCV 92 79 - 97 fL   MCH 29.5 26.6 - 33.0 pg   MCHC 32.2 31.5 - 35.7 g/dL   RDW 12.7 11.7 - 15.4 %   Platelets 348 150 - 450 x10E3/uL   Neutrophils 57 Not Estab. %   Lymphs 32 Not Estab. %   Monocytes 7 Not Estab. %   Eos 3 Not Estab. %  Basos 1 Not Estab. %   Neutrophils Absolute 4.0 1.4 - 7.0 x10E3/uL   Lymphocytes Absolute 2.2 0.7 - 3.1 x10E3/uL   Monocytes Absolute 0.5 0.1 - 0.9 x10E3/uL   EOS (ABSOLUTE) 0.2 0.0 - 0.4 x10E3/uL   Basophils Absolute 0.1 0.0 - 0.2 x10E3/uL   Immature Granulocytes 0 Not Estab. %   Immature Grans (Abs) 0.0 0.0 - 0.1 x10E3/uL  Comp Met (CMET)  Result Value Ref Range   Glucose 244 (H) 65 - 99 mg/dL   BUN 11 6 - 20 mg/dL   Creatinine, Ser 0.80 0.57 - 1.00 mg/dL   GFR calc non Af Amer 94 >59 mL/min/1.73   GFR calc Af Amer 108 >59 mL/min/1.73   BUN/Creatinine Ratio 14 9 - 23   Sodium 136 134 - 144 mmol/L   Potassium 4.6 3.5 - 5.2 mmol/L   Chloride 97 96 - 106 mmol/L   CO2 27 20 - 29 mmol/L   Calcium 9.4 8.7 - 10.2 mg/dL   Total Protein 7.8 6.0 - 8.5 g/dL   Albumin 4.6 3.8 - 4.8 g/dL   Globulin, Total 3.2 1.5 - 4.5 g/dL   Albumin/Globulin Ratio 1.4 1.2 - 2.2   Bilirubin Total 0.4 0.0 - 1.2 mg/dL   Alkaline Phosphatase 62 39 - 117 IU/L   AST 23 0 - 40 IU/L   ALT 38 (H) 0 - 32 IU/L  Lipid Panel w/o Chol/HDL Ratio OUT  Result Value Ref Range   Cholesterol, Total  177 100 - 199 mg/dL   Triglycerides 166 (H) 0 - 149 mg/dL   HDL 35 (L) >39 mg/dL   VLDL Cholesterol Cal 29 5 - 40 mg/dL   LDL Chol Calc (NIH) 113 (H) 0 - 99 mg/dL  Microalbumin, Urine Waived  Result Value Ref Range   Microalb, Ur Waived 30 (H) 0 - 19 mg/L   Creatinine, Urine Waived 100 10 - 300 mg/dL   Microalb/Creat Ratio <30 <30 mg/g  TSH  Result Value Ref Range   TSH 1.210 0.450 - 4.500 uIU/mL  UA/M w/rflx Culture, Routine   Specimen: Blood   BLD  Result Value Ref Range   Specific Gravity, UA 1.020 1.005 - 1.030   pH, UA 6.0 5.0 - 7.5   Color, UA Yellow Yellow   Appearance Ur Clear Clear   Leukocytes,UA Negative Negative   Protein,UA Negative Negative/Trace   Glucose, UA 3+ (A) Negative   Ketones, UA Negative Negative   RBC, UA Negative Negative   Bilirubin, UA Negative Negative   Urobilinogen, Ur 1.0 0.2 - 1.0 mg/dL   Nitrite, UA Negative Negative  Pap over 30  Result Value Ref Range   High risk HPV Negative    Adequacy      Satisfactory for evaluation; transformation zone component PRESENT.   Diagnosis      - Negative for intraepithelial lesion or malignancy (NILM)   Comment Normal Reference Range HPV - Negative       Assessment & Plan:   Problem List Items Addressed This Visit    None    Visit Diagnoses    Vaginal candidiasis    -  Primary   Tx with diflucan, continue eating yogurt daily. If recurring frequently may need to switch off jardiance down the line   Relevant Medications   fluconazole (DIFLUCAN) 150 MG tablet   metroNIDAZOLE (FLAGYL) 500 MG tablet   Other Relevant Orders   WET PREP FOR TRICH, YEAST, CLUE   BV (bacterial vaginosis)  Tx with flagyl, vaginal hygiene reviewed, already eats yogurt daily   Relevant Medications   fluconazole (DIFLUCAN) 150 MG tablet   metroNIDAZOLE (FLAGYL) 500 MG tablet   Other Relevant Orders   UA/M w/rflx Culture, Routine   Need for Tdap vaccination       Relevant Orders   Tdap vaccine greater than or  equal to 7yo IM (Completed)       Follow up plan: Return if symptoms worsen or fail to improve.

## 2019-11-25 ENCOUNTER — Encounter: Payer: Self-pay | Admitting: Family Medicine

## 2019-11-30 ENCOUNTER — Ambulatory Visit (INDEPENDENT_AMBULATORY_CARE_PROVIDER_SITE_OTHER): Payer: BC Managed Care – PPO | Admitting: Nurse Practitioner

## 2019-11-30 ENCOUNTER — Encounter: Payer: Self-pay | Admitting: Nurse Practitioner

## 2019-11-30 ENCOUNTER — Other Ambulatory Visit: Payer: Self-pay

## 2019-11-30 VITALS — BP 136/76 | HR 65 | Temp 97.6°F | Ht 62.0 in | Wt 254.0 lb

## 2019-11-30 DIAGNOSIS — N898 Other specified noninflammatory disorders of vagina: Secondary | ICD-10-CM | POA: Diagnosis not present

## 2019-11-30 DIAGNOSIS — R300 Dysuria: Secondary | ICD-10-CM

## 2019-11-30 LAB — WET PREP FOR TRICH, YEAST, CLUE
Clue Cell Exam: NEGATIVE
Trichomonas Exam: NEGATIVE
Yeast Exam: NEGATIVE

## 2019-11-30 LAB — UA/M W/RFLX CULTURE, ROUTINE
Bilirubin, UA: NEGATIVE
Ketones, UA: NEGATIVE
Leukocytes,UA: NEGATIVE
Nitrite, UA: NEGATIVE
Protein,UA: NEGATIVE
RBC, UA: NEGATIVE
Specific Gravity, UA: 1.02 (ref 1.005–1.030)
Urobilinogen, Ur: 1 mg/dL (ref 0.2–1.0)
pH, UA: 7 (ref 5.0–7.5)

## 2019-11-30 LAB — PREGNANCY, URINE: Preg Test, Ur: NEGATIVE

## 2019-11-30 MED ORDER — FLUCONAZOLE 150 MG PO TABS: 150.0000 mg | ORAL_TABLET | Freq: Once | ORAL | 0 refills | Status: AC

## 2019-11-30 MED ORDER — NYSTATIN 100000 UNIT/GM EX POWD: 1.0000 "application " | Freq: Three times a day (TID) | CUTANEOUS | 0 refills | Status: DC

## 2019-11-30 NOTE — Progress Notes (Signed)
BP 136/76 (BP Location: Left Arm, Patient Position: Sitting, Cuff Size: Normal)   Pulse 65   Temp 97.6 F (36.4 C) (Oral)   Ht 5\' 2"  (1.575 m)   Wt 254 lb (115.2 kg)   SpO2 96%   BMI 46.46 kg/m    Subjective:    Patient ID: Mary Drake, female    DOB: [birth year], 39 y.o.   MRN: [id number]  HPI: Mary Drake is a 39 y.o. female presenting for vaginal discharge, itching, and burning.  Chief Complaint  Patient presents with  . Vaginal Itching  . Vaginal Discharge  . Vaginal Burning   VAGINAL DISCHARGE Takes Jardiance, thinks symptoms may be due to this medication.  Has appointment with primary care provider next week and plans to discuss medication then. Duration: weeks; better than it was before Discharge description: clear ; sometimes milky on toilet paper Pruritus: yes at times Dysuria: yes Malodorous: no Urinary frequency: yes  Urinary urgency: yes Fevers: no Abdominal pain: no  Sexual activity: not sexually active; urinates after sexual activity History of sexually transmitted diseases: no; previous yeast/BV infection Recent antibiotic use: yes; Flagyl and fluconazole Context: better but still there Treatments attempted: non-fragrance soaps, shower  Took Plan B 2 weeks ago.   Allergies  Allergen Reactions  . Codeine    Outpatient Encounter Medications as of 11/30/2019  Medication Sig  . cetirizine (ZYRTEC) 10 MG tablet Take 10 mg by mouth daily.  . empagliflozin (JARDIANCE) 25 MG TABS tablet Take 25 mg by mouth daily before breakfast.  . metFORMIN (GLUCOPHAGE) 500 MG tablet Take 2 tablets (1,000 mg total) by mouth 2 (two) times daily with a meal.  . nortriptyline (PAMELOR) 25 MG capsule Take 1 capsule (25 mg total) by mouth at bedtime.  Marland Kitchen TALTZ 80 MG/ML SOAJ   . fluconazole (DIFLUCAN) 150 MG tablet Take 1 tablet (150 mg total) by mouth once for 1 dose.  . nystatin (MYCOSTATIN/NYSTOP) powder Apply 1 application topically 3 (three) times daily.   . [DISCONTINUED] fluconazole (DIFLUCAN) 150 MG tablet Take 1 tablet (150 mg total) by mouth once a week. (Patient not taking: Reported on 11/30/2019)  . [DISCONTINUED] folic acid (FOLVITE) 557 MCG tablet Take 400 mcg by mouth daily.  . [DISCONTINUED] methotrexate (RHEUMATREX) 2.5 MG tablet Take 20 mg by mouth once a week. 6 tablets weekly   . [DISCONTINUED] metroNIDAZOLE (FLAGYL) 500 MG tablet Take 1 tablet (500 mg total) by mouth 2 (two) times daily. (Patient not taking: Reported on 11/30/2019)   No facility-administered encounter medications on file as of 11/30/2019.   Patient Active Problem List   Diagnosis Date Noted  . Vaginal discharge 11/30/2019  . Dysuria 11/30/2019  . Migraines 07/01/2019  . 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 Diabetes mellitus type 2, uncomplicated ([facility]) 32/20/2542   Past Medical History:  Diagnosis Date  . Migraines   . Psoriasis    Relevant past medical, surgical, family and social history reviewed and updated as indicated. Interim medical history since our last visit reviewed.  Review of Systems  Constitutional: Negative.  Negative for activity change, fatigue and fever.  Gastrointestinal: Negative.  Negative for abdominal pain.  Genitourinary: Positive for dysuria, frequency, urgency and vaginal discharge. Negative for decreased urine volume, difficulty urinating, dyspareunia, flank pain, genital sores, hematuria, menstrual problem, pelvic pain, vaginal bleeding and vaginal pain.  Skin: Negative.  Negative for color change and rash.  Neurological: Negative.  Negative for dizziness, weakness and headaches.  Psychiatric/Behavioral:  Negative.  Negative for confusion and decreased concentration. The patient is not nervous/anxious.    Per HPI unless specifically indicated above     Objective:    BP 136/76 (BP Location: Left Arm, Patient Position: Sitting, Cuff Size: Normal)   Pulse 65   Temp 97.6 F (36.4 C) (Oral)   Ht 5\' 2"  (1.575 m)   Wt 254 lb  (115.2 kg)   SpO2 96%   BMI 46.46 kg/m   Wt Readings from Last 3 Encounters:  11/30/19 254 lb (115.2 kg)  09/30/19 257 lb (116.6 kg)  09/01/19 258 lb 6 oz (117.2 kg)    Physical Exam Vitals and nursing note reviewed.  Constitutional:      General: She is not in acute distress.    Appearance: Normal appearance. She is obese.  Abdominal:     General: Abdomen is flat. Bowel sounds are normal. There is no distension.     Tenderness: There is no abdominal tenderness. There is no right CVA tenderness or left CVA tenderness.  Skin:    General: Skin is warm and dry.     Coloration: Skin is not jaundiced or pale.  Neurological:     General: No focal deficit present.     Mental Status: She is alert and oriented to person, place, and time. Mental status is at baseline.     Motor: No weakness.     Gait: Gait normal.  Psychiatric:        Mood and Affect: Mood normal.        Behavior: Behavior normal.        Thought Content: Thought content normal.        Judgment: Judgment normal.      Assessment & Plan:   Problem List Items Addressed This Visit      Other   Vaginal discharge - Primary    Acute, ongoing.  Wet prep negative today.  Will treat empirically with fluconazole given reported increase in soda intake lately.  Educated to continue Jardiance until next weeks appt with primary care provider.  Rx also sent in for nystatin powder to help with ?yeast in folds.      Relevant Medications   fluconazole (DIFLUCAN) 150 MG tablet   nystatin (MYCOSTATIN/NYSTOP) powder   Other Relevant Orders   WET PREP FOR TRICH, YEAST, CLUE   Pregnancy, urine   Dysuria    Acute, ongoing.  UA negative and pregnancy test negative today.  Will hold off on treatment, educated on side effects of caffeine and encouraged to limit caffeine and increase water intake drastically.      Relevant Medications   fluconazole (DIFLUCAN) 150 MG tablet   Other Relevant Orders   UA/M w/rflx Culture, Routine    Pregnancy, urine       Follow up plan: Return if symptoms worsen or fail to improve.

## 2019-11-30 NOTE — Patient Instructions (Signed)
Interstitial Cystitis  Interstitial cystitis is inflammation of the bladder. This may cause pain in the bladder area as well as a frequent and urgent need to urinate. The bladder is a hollow organ in the lower part of the abdomen. It stores urine after the urine is made in the kidneys. The severity of interstitial cystitis can vary from person to person. You may have flare-ups, and then your symptoms may go away for a while. For many people, it becomes a long-term (chronic) problem. What are the causes? The cause of this condition is not known. What increases the risk? The following factors may make you more likely to develop this condition:  You are female.  You have fibromyalgia.  You have irritable bowel syndrome (IBS).  You have endometriosis. This condition may be aggravated by:  Stress.  Smoking.  Spicy foods. What are the signs or symptoms? Symptoms of interstitial cystitis vary, and they can change over time. Symptoms may include:  Discomfort or pain in the bladder area, which is in the lower abdomen. Pain can range from mild to severe. The pain may change in intensity as the bladder fills with urine or as it empties.  Pain in the pelvic area, between the hip bones.  An urgent need to urinate.  Frequent urination.  Pain during urination.  Pain during sex.  Blood in the urine. For women, symptoms often get worse during menstruation. How is this diagnosed? This condition is diagnosed based on your symptoms, your medical history, and a physical exam. You may have tests to rule out other conditions, such as:  Urine tests.  Cystoscopy. For this test, a tool similar to a very thin telescope is used to look into your bladder.  Biopsy. This involves taking a sample of tissue from the bladder to be examined under a microscope. How is this treated? There is no cure for this condition, but treatment can help you control your symptoms. Work closely with your health care  provider to find the most effective treatments for you. Treatment options may include:  Medicines to relieve pain and reduce how often you feel the need to urinate.  Learning ways to control when you urinate (bladder training).  Lifestyle changes, such as changing your diet or taking steps to control stress.  Using a device that provides electrical stimulation to your nerves, which can relieve pain (neuromodulation therapy). The device is placed on your back, where it blocks the nerves that cause you to feel pain in your bladder area.  A procedure that stretches your bladder by filling it with air or fluid.  Surgery. This is rare. It is only done for extreme cases, if other treatments do not help. Follow these instructions at home: Bladder training   Use bladder training techniques as directed. Techniques may include: ? Urinating at scheduled times. ? Training yourself to delay urination. ? Doing exercises (Kegel exercises) to strengthen the muscles that control urine flow.  Keep a bladder diary. ? Write down the times that you urinate and any symptoms that you have. This can help you find out which foods, liquids, or activities make your symptoms worse. ? Use your bladder diary to schedule bathroom trips. If you are away from home, plan to be near a bathroom at each of your scheduled times.  Make sure that you urinate just before you leave the house and just before you go to bed. Eating and drinking  Make dietary changes as recommended by your health care provider. You   may need to avoid: ? Spicy foods. ? Foods that contain a lot of potassium.  Limit your intake of beverages that make you need to urinate. These include: ? Caffeinated beverages like soda, coffee, and tea. ? Alcohol. General instructions  Take over-the-counter and prescription medicines only as told by your health care provider.  Do not drink alcohol.  You can try a warm or cool compress over your bladder for  comfort.  Avoid wearing tight clothing.  Do not use any products that contain nicotine or tobacco, such as cigarettes and e-cigarettes. If you need help quitting, ask your health care provider.  Keep all follow-up visits as told by your health care provider. This is important. Contact a health care provider if you have:  Symptoms that do not get better with treatment.  Pain or discomfort that gets worse.  More frequent urges to urinate.  A fever. Get help right away if:  You have no control over when you urinate. Summary  Interstitial cystitis is inflammation of the bladder.  This condition may cause pain in the bladder area as well as a frequent and urgent need to urinate.  You may have flare-ups of the condition, and then it may go away for a while. For many people, it becomes a long-term (chronic) problem.  There is no cure for interstitial cystitis, but treatment methods are available to control your symptoms. This information is not intended to replace advice given to you by your health care provider. Make sure you discuss any questions you have with your health care provider. Document Revised: 07/26/2017 Document Reviewed: 07/08/2017 Elsevier Patient Education  2020 [employer]  

## 2019-11-30 NOTE — Assessment & Plan Note (Signed)
Acute, ongoing.  UA negative and pregnancy test negative today.  Will hold off on treatment, educated on side effects of caffeine and encouraged to limit caffeine and increase water intake drastically.

## 2019-11-30 NOTE — Assessment & Plan Note (Signed)
Acute, ongoing.  Wet prep negative today.  Will treat empirically with fluconazole given reported increase in soda intake lately.  Educated to continue Jardiance until next weeks appt with primary care provider.  Rx also sent in for nystatin powder to help with ?yeast in folds.

## 2019-12-07 ENCOUNTER — Ambulatory Visit: Payer: BC Managed Care – PPO | Admitting: Family Medicine

## 2020-01-27 ENCOUNTER — Encounter: Payer: Self-pay | Admitting: Nurse Practitioner

## 2020-02-10 LAB — HM DIABETES EYE EXAM

## 2020-03-10 ENCOUNTER — Ambulatory Visit: Payer: BC Managed Care – PPO | Admitting: Family Medicine

## 2020-03-14 ENCOUNTER — Ambulatory Visit: Payer: BC Managed Care – PPO | Admitting: Family Medicine

## 2020-03-21 ENCOUNTER — Encounter: Payer: Self-pay | Admitting: Family Medicine

## 2020-03-21 ENCOUNTER — Ambulatory Visit (INDEPENDENT_AMBULATORY_CARE_PROVIDER_SITE_OTHER): Payer: BC Managed Care – PPO | Admitting: Family Medicine

## 2020-03-21 ENCOUNTER — Other Ambulatory Visit: Payer: Self-pay

## 2020-03-21 VITALS — BP 132/86 | HR 81 | Temp 97.7°F | Ht 62.6 in | Wt 265.0 lb

## 2020-03-21 DIAGNOSIS — G43109 Migraine with aura, not intractable, without status migrainosus: Secondary | ICD-10-CM

## 2020-03-21 DIAGNOSIS — E119 Type 2 diabetes mellitus without complications: Secondary | ICD-10-CM

## 2020-03-21 DIAGNOSIS — Z1159 Encounter for screening for other viral diseases: Secondary | ICD-10-CM | POA: Diagnosis not present

## 2020-03-21 MED ORDER — EMPAGLIFLOZIN 25 MG PO TABS: 25.0000 mg | ORAL_TABLET | Freq: Every day | ORAL | 3 refills | Status: DC

## 2020-03-21 MED ORDER — AZELASTINE HCL 0.1 % NA SOLN: 2.0000 | Freq: Two times a day (BID) | NASAL | 12 refills | Status: DC

## 2020-03-21 MED ORDER — NORTRIPTYLINE HCL 25 MG PO CAPS: 25.0000 mg | ORAL_CAPSULE | Freq: Every day | ORAL | 1 refills | Status: DC

## 2020-03-21 MED ORDER — METFORMIN HCL 500 MG PO TABS: 1000.0000 mg | ORAL_TABLET | Freq: Two times a day (BID) | ORAL | 1 refills | Status: DC

## 2020-03-21 NOTE — Assessment & Plan Note (Signed)
Under good control on current regimen. Continue current regimen. Continue to monitor. Call with any concerns. Refills given. FMLA paperwork given today.

## 2020-03-21 NOTE — Assessment & Plan Note (Signed)
Stopped her jardiance due to yeast infections. A1c better at 8.8. Will continue metformin and restart jardiance. Check tolerance 1 month. Call with any concerns.

## 2020-03-21 NOTE — Progress Notes (Signed)
BP (!) 132/86 (BP Location: Left Arm, Patient Position: Sitting, Cuff Size: Large)   Pulse 81   Temp 97.7 F (36.5 C) (Oral)   Ht 5' 2.6" (1.59 m)   Wt (!) 265 lb (120.2 kg)   LMP 03/10/2020   SpO2 95%   BMI 47.55 kg/m    Subjective:    Patient ID: Mary Drake, female    DOB: Jan 12, 1981, 39 y.o.   MRN: [id number]  HPI: Mary Drake is a 39 y.o. female  Chief Complaint  Patient presents with  . Migraine    FMLA  . Diabetes   DIABETES- got a yeast infection on the jardiance and stopped it.  Hypoglycemic episodes:no Polydipsia/polyuria: no Visual disturbance: no Chest pain: no Paresthesias: no Glucose Monitoring: no  Accucheck frequency: Not Checking Taking Insulin?: no Blood Pressure Monitoring: not checking Retinal Examination: Up to Date Foot Exam: Not up to Date Diabetic Education: Completed Pneumovax: Up to Date Influenza: Up to Date Aspirin: no  MIGRAINES Duration: chronic Onset: sudden Severity: severe Quality: sharp Frequency: 1-3 a month, but occasionally going stretches without them Location: behind her eye Headache duration: hours Radiation: no Time of day headache occurs: at random Headache status at time of visit: asymptomatic Treatments attempted: Treatments attempted: rest, ice, heat, APAP, ibuprofen, aleve", excedrine, triptans and amitriptyline   Aura: yes Nausea:  yes Vomiting: yes Photophobia:  yes Phonophobia:  yes Effect on social functioning:  yes Numbers of missed days of school/work each month: 1-3 Confusion:  no Gait disturbance/ataxia:  no Behavioral changes:  no Fevers:  no  Relevant past medical, surgical, family and social history reviewed and updated as indicated. Interim medical history since our last visit reviewed. Allergies and medications reviewed and updated.  Review of Systems  Constitutional: Negative.   Respiratory: Negative.   Cardiovascular: Negative.   Gastrointestinal: Negative.     Musculoskeletal: Negative.   Neurological: Positive for headaches. Negative for dizziness, tremors, seizures, syncope, facial asymmetry, speech difficulty, weakness, light-headedness and numbness.  Psychiatric/Behavioral: Negative.     Per HPI unless specifically indicated above     Objective:    BP (!) 132/86 (BP Location: Left Arm, Patient Position: Sitting, Cuff Size: Large)   Pulse 81   Temp 97.7 F (36.5 C) (Oral)   Ht 5' 2.6" (1.59 m)   Wt (!) 265 lb (120.2 kg)   LMP 03/10/2020   SpO2 95%   BMI 47.55 kg/m   Wt Readings from Last 3 Encounters:  03/21/20 (!) 265 lb (120.2 kg)  11/30/19 254 lb (115.2 kg)  09/30/19 257 lb (116.6 kg)    Physical Exam Vitals and nursing note reviewed.  Constitutional:      General: She is not in acute distress.    Appearance: Normal appearance. She is not ill-appearing, toxic-appearing or diaphoretic.  HENT:     Head: Normocephalic and atraumatic.     Right Ear: External ear normal.     Left Ear: External ear normal.     Nose: Nose normal.     Mouth/Throat:     Mouth: Mucous membranes are moist.     Pharynx: Oropharynx is clear.  Eyes:     General: No scleral icterus.       Right eye: No discharge.        Left eye: No discharge.     Extraocular Movements: Extraocular movements intact.     Conjunctiva/sclera: Conjunctivae normal.     Pupils: Pupils are equal, round, and reactive to light.  Cardiovascular:     Rate and Rhythm: Normal rate and regular rhythm.     Pulses: Normal pulses.     Heart sounds: Normal heart sounds. No murmur heard.  No friction rub. No gallop.   Pulmonary:     Effort: Pulmonary effort is normal. No respiratory distress.     Breath sounds: Normal breath sounds. No stridor. No wheezing, rhonchi or rales.  Chest:     Chest wall: No tenderness.  Musculoskeletal:        General: Normal range of motion.     Cervical back: Normal range of motion and neck supple.  Skin:    General: Skin is warm and dry.      Capillary Refill: Capillary refill takes less than 2 seconds.     Coloration: Skin is not jaundiced or pale.     Findings: No bruising, erythema, lesion or rash.  Neurological:     General: No focal deficit present.     Mental Status: She is alert and oriented to person, place, and time. Mental status is at baseline.  Psychiatric:        Mood and Affect: Mood normal.        Behavior: Behavior normal.        Thought Content: Thought content normal.        Judgment: Judgment normal.     Results for orders placed or performed in visit on 02/10/20  HM DIABETES EYE EXAM  Result Value Ref Range   HM Diabetic Eye Exam No Retinopathy No Retinopathy      Assessment & Plan:   Problem List Items Addressed This Visit      Cardiovascular and Mediastinum   Migraines    Under good control on current regimen. Continue current regimen. Continue to monitor. Call with any concerns. Refills given. FMLA paperwork given today.       Relevant Medications   nortriptyline (PAMELOR) 25 MG capsule     Endocrine   Diabetes mellitus type 2, uncomplicated ([facility]) - Primary    Stopped her jardiance due to yeast infections. A1c better at 8.8. Will continue metformin and restart jardiance. Check tolerance 1 month. Call with any concerns.       Relevant Medications   metFORMIN (GLUCOPHAGE) 500 MG tablet   empagliflozin (JARDIANCE) 25 MG TABS tablet   Other Relevant Orders   Bayer DCA Hb A1c Waived   CBC with Differential/Platelet   Comprehensive metabolic panel   Lipid Panel w/o Chol/HDL Ratio   Microalbumin, Urine Waived   TSH   Urinalysis, Routine w reflex microscopic    Other Visit Diagnoses    Encounter for hepatitis C screening test for low risk patient       Labs drawn today. Await results.    Relevant Orders   Hepatitis C Antibody       Follow up plan: Return in about 4 weeks (around 04/18/2020).

## 2020-03-22 LAB — LIPID PANEL W/O CHOL/HDL RATIO
Cholesterol, Total: 193 mg/dL (ref 100–199)
HDL: 35 mg/dL — ABNORMAL LOW (ref >39)
LDL Chol Calc (NIH): 121 mg/dL — ABNORMAL HIGH (ref 0–99)
Triglycerides: 207 mg/dL — ABNORMAL HIGH (ref 0–149)
VLDL Cholesterol Cal: 37 mg/dL (ref 5–40)

## 2020-03-22 LAB — COMPREHENSIVE METABOLIC PANEL
ALT: 25 IU/L (ref 0–32)
AST: 11 IU/L (ref 0–40)
Albumin/Globulin Ratio: 1.4 (ref 1.2–2.2)
Albumin: 4.4 g/dL (ref 3.8–4.8)
Alkaline Phosphatase: 70 IU/L (ref 48–121)
BUN/Creatinine Ratio: 19 (ref 9–23)
BUN: 15 mg/dL (ref 6–20)
Bilirubin Total: 0.2 mg/dL (ref 0.0–1.2)
CO2: 22 mmol/L (ref 20–29)
Calcium: 9.2 mg/dL (ref 8.7–10.2)
Chloride: 99 mmol/L (ref 96–106)
Creatinine, Ser: 0.8 mg/dL (ref 0.57–1.00)
GFR calc Af Amer: 107 mL/min/{1.73_m2} (ref >59)
GFR calc non Af Amer: 93 mL/min/{1.73_m2} (ref >59)
Globulin, Total: 3.1 g/dL (ref 1.5–4.5)
Glucose: 186 mg/dL — ABNORMAL HIGH (ref 65–99)
Potassium: 4.2 mmol/L (ref 3.5–5.2)
Sodium: 136 mmol/L (ref 134–144)
Total Protein: 7.5 g/dL (ref 6.0–8.5)

## 2020-03-22 LAB — CBC WITH DIFFERENTIAL/PLATELET
Basophils Absolute: 0.1 10*3/uL (ref 0.0–0.2)
Basos: 1 %
EOS (ABSOLUTE): 0.3 10*3/uL (ref 0.0–0.4)
Eos: 3 %
Hematocrit: 42 % (ref 34.0–46.6)
Hemoglobin: 14.4 g/dL (ref 11.1–15.9)
Immature Grans (Abs): 0 10*3/uL (ref 0.0–0.1)
Immature Granulocytes: 0 %
Lymphocytes Absolute: 3.4 10*3/uL — ABNORMAL HIGH (ref 0.7–3.1)
Lymphs: 33 %
MCH: 30.4 pg (ref 26.6–33.0)
MCHC: 34.3 g/dL (ref 31.5–35.7)
MCV: 89 fL (ref 79–97)
Monocytes Absolute: 0.6 10*3/uL (ref 0.1–0.9)
Monocytes: 6 %
Neutrophils Absolute: 6 10*3/uL (ref 1.4–7.0)
Neutrophils: 57 %
Platelets: 402 10*3/uL (ref 150–450)
RBC: 4.74 x10E6/uL (ref 3.77–5.28)
RDW: 13.2 % (ref 11.7–15.4)
WBC: 10.4 10*3/uL (ref 3.4–10.8)

## 2020-03-22 LAB — URINALYSIS, ROUTINE W REFLEX MICROSCOPIC
Bilirubin, UA: NEGATIVE
Leukocytes,UA: NEGATIVE
Nitrite, UA: NEGATIVE
Protein,UA: NEGATIVE
RBC, UA: NEGATIVE
Specific Gravity, UA: 1.02 (ref 1.005–1.030)
Urobilinogen, Ur: 0.2 mg/dL (ref 0.2–1.0)
pH, UA: 6.5 (ref 5.0–7.5)

## 2020-03-22 LAB — MICROALBUMIN, URINE WAIVED
Creatinine, Urine Waived: 100 mg/dL (ref 10–300)
Microalb, Ur Waived: 30 mg/L — ABNORMAL HIGH (ref 0–19)
Microalb/Creat Ratio: <30 mg/g (ref <30)

## 2020-03-22 LAB — TSH: TSH: 2.04 u[IU]/mL (ref 0.450–4.500)

## 2020-03-22 LAB — HEPATITIS C ANTIBODY: Hep C Virus Ab: <0.1 s/co ratio (ref 0.0–0.9)

## 2020-03-22 LAB — BAYER DCA HB A1C WAIVED: HB A1C (BAYER DCA - WAIVED): 8.8 % — ABNORMAL HIGH (ref <7.0)

## 2020-03-23 ENCOUNTER — Telehealth: Payer: Self-pay

## 2020-03-23 NOTE — Telephone Encounter (Signed)
Patient came in for [facility] paperwork.  I asked the patient if someone called her and she said no but stated Dr. Laural Benes told her it would be ready by today. Looked in folders and paperwork hasn't been completed and signed yet. Explained to the patient that paperwork was not ready and someone would call her when it's ready. Patient stated it was due by the 30th and patient seemed angry.  Paperwork is in provider's folder for completion.  Could someone call patient when paperwork is completed?

## 2020-03-24 NOTE — Telephone Encounter (Signed)
Form has been completed and placed in provider folder for signature.

## 2020-03-25 NOTE — Telephone Encounter (Signed)
Paperwork faxed and confirmed. Notified patient and she verbalized understanding.

## 2020-03-25 NOTE — Telephone Encounter (Signed)
Patient is checking status of FMLA paper work. Patient would like to pick up paper work if she needs to sign anything if not she would just like office to go ahead and fax over Call back [phone]

## 2020-04-28 ENCOUNTER — Ambulatory Visit (INDEPENDENT_AMBULATORY_CARE_PROVIDER_SITE_OTHER): Payer: BC Managed Care – PPO | Admitting: Family Medicine

## 2020-04-28 ENCOUNTER — Other Ambulatory Visit: Payer: Self-pay

## 2020-04-28 ENCOUNTER — Encounter: Payer: Self-pay | Admitting: Family Medicine

## 2020-04-28 VITALS — BP 137/88 | HR 69 | Temp 97.9°F | Wt 260.0 lb

## 2020-04-28 DIAGNOSIS — E119 Type 2 diabetes mellitus without complications: Secondary | ICD-10-CM | POA: Diagnosis not present

## 2020-04-28 MED ORDER — NYSTATIN 100000 UNIT/GM EX POWD
1.0000 | Freq: Three times a day (TID) | CUTANEOUS | 0 refills | Status: DC
Start: 2020-04-28 — End: 2020-09-26

## 2020-04-28 NOTE — Progress Notes (Signed)
BP 137/88 (BP Location: Left Arm, Patient Position: Sitting, Cuff Size: Normal)   Pulse 69   Temp 97.9 F (36.6 C) (Oral)   Wt 260 lb (117.9 kg)   LMP 04/27/2020 (Exact Date)   SpO2 97%   BMI 46.65 kg/m    Subjective:    Patient ID: Mary Drake, female    DOB: [birth year], 39 y.o.   MRN: [id number]  HPI: Mary Drake is a 39 y.o. female  Chief Complaint  Patient presents with  . Diabetes   DIABETES- tolerating medicine well.  Hypoglycemic episodes:no Polydipsia/polyuria: no Visual disturbance: no Chest pain: no Paresthesias: no Glucose Monitoring: no  Accucheck frequency: Not Checking  Fasting glucose: Taking Insulin?: no Blood Pressure Monitoring: not checking Retinal Examination: Up to Date Foot Exam: Up to Date Diabetic Education: Completed Pneumovax: Up to Date Influenza: will get it through work Aspirin: no   Relevant past medical, surgical, family and social history reviewed and updated as indicated. Interim medical history since our last visit reviewed. Allergies and medications reviewed and updated.  Review of Systems  Constitutional: Negative.   Respiratory: Negative.   Cardiovascular: Negative.   Gastrointestinal: Negative.   Musculoskeletal: Positive for arthralgias, back pain and myalgias. Negative for gait problem, joint swelling, neck pain and neck stiffness.  Neurological: Negative.   Psychiatric/Behavioral: Negative.     Per HPI unless specifically indicated above     Objective:    BP 137/88 (BP Location: Left Arm, Patient Position: Sitting, Cuff Size: Normal)   Pulse 69   Temp 97.9 F (36.6 C) (Oral)   Wt 260 lb (117.9 kg)   LMP 04/27/2020 (Exact Date)   SpO2 97%   BMI 46.65 kg/m   Wt Readings from Last 3 Encounters:  04/28/20 260 lb (117.9 kg)  03/21/20 (!) 265 lb (120.2 kg)  11/30/19 254 lb (115.2 kg)    Physical Exam Vitals and nursing note reviewed.  Constitutional:      General: She is not in acute  distress.    Appearance: Normal appearance. She is not ill-appearing, toxic-appearing or diaphoretic.  HENT:     Head: Normocephalic and atraumatic.     Right Ear: External ear normal.     Left Ear: External ear normal.     Nose: Nose normal.     Mouth/Throat:     Mouth: Mucous membranes are moist.     Pharynx: Oropharynx is clear.  Eyes:     General: No scleral icterus.       Right eye: No discharge.        Left eye: No discharge.     Extraocular Movements: Extraocular movements intact.     Conjunctiva/sclera: Conjunctivae normal.     Pupils: Pupils are equal, round, and reactive to light.  Cardiovascular:     Rate and Rhythm: Normal rate and regular rhythm.     Pulses: Normal pulses.     Heart sounds: Normal heart sounds. No murmur heard.  No friction rub. No gallop.   Pulmonary:     Effort: Pulmonary effort is normal. No respiratory distress.     Breath sounds: Normal breath sounds. No stridor. No wheezing, rhonchi or rales.  Chest:     Chest wall: No tenderness.  Musculoskeletal:        General: Normal range of motion.     Cervical back: Normal range of motion and neck supple.  Skin:    General: Skin is warm and dry.     Capillary Refill:  Capillary refill takes less than 2 seconds.     Coloration: Skin is not jaundiced or pale.     Findings: No bruising, erythema, lesion or rash.  Neurological:     General: No focal deficit present.     Mental Status: She is alert and oriented to person, place, and time. Mental status is at baseline.  Psychiatric:        Mood and Affect: Mood normal.        Behavior: Behavior normal.        Thought Content: Thought content normal.        Judgment: Judgment normal.     Results for orders placed or performed in visit on 03/21/20  Bayer DCA Hb A1c Waived  Result Value Ref Range   HB A1C (BAYER DCA - WAIVED) 8.8 (H) <7.0 %  CBC with Differential/Platelet  Result Value Ref Range   WBC 10.4 3.4 - 10.8 x10E3/uL   RBC 4.74 3.77 - 5.28  x10E6/uL   Hemoglobin 14.4 11.1 - 15.9 g/dL   Hematocrit 16.0 73.7 - 46.6 %   MCV 89 79 - 97 fL   MCH 30.4 26.6 - 33.0 pg   MCHC 34.3 31 - 35 g/dL   RDW 10.6 26.9 - 48.5 %   Platelets 402 150 - 450 x10E3/uL   Neutrophils 57 Not Estab. %   Lymphs 33 Not Estab. %   Monocytes 6 Not Estab. %   Eos 3 Not Estab. %   Basos 1 Not Estab. %   Neutrophils Absolute 6.0 1 - 7 x10E3/uL   Lymphocytes Absolute 3.4 (H) 0 - 3 x10E3/uL   Monocytes Absolute 0.6 0 - 0 x10E3/uL   EOS (ABSOLUTE) 0.3 0.0 - 0.4 x10E3/uL   Basophils Absolute 0.1 0 - 0 x10E3/uL   Immature Granulocytes 0 Not Estab. %   Immature Grans (Abs) 0.0 0.0 - 0.1 x10E3/uL  Comprehensive metabolic panel  Result Value Ref Range   Glucose 186 (H) 65 - 99 mg/dL   BUN 15 6 - 20 mg/dL   Creatinine, Ser 4.62 0.57 - 1.00 mg/dL   GFR calc non Af Amer 93 >59 mL/min/1.73   GFR calc Af Amer 107 >59 mL/min/1.73   BUN/Creatinine Ratio 19 9 - 23   Sodium 136 134 - 144 mmol/L   Potassium 4.2 3.5 - 5.2 mmol/L   Chloride 99 96 - 106 mmol/L   CO2 22 20 - 29 mmol/L   Calcium 9.2 8.7 - 10.2 mg/dL   Total Protein 7.5 6.0 - 8.5 g/dL   Albumin 4.4 3.8 - 4.8 g/dL   Globulin, Total 3.1 1.5 - 4.5 g/dL   Albumin/Globulin Ratio 1.4 1.2 - 2.2   Bilirubin Total 0.2 0.0 - 1.2 mg/dL   Alkaline Phosphatase 70 48 - 121 IU/L   AST 11 0 - 40 IU/L   ALT 25 0 - 32 IU/L  Lipid Panel w/o Chol/HDL Ratio  Result Value Ref Range   Cholesterol, Total 193 100 - 199 mg/dL   Triglycerides 703 (H) 0 - 149 mg/dL   HDL 35 (L) >50 mg/dL   VLDL Cholesterol Cal 37 5 - 40 mg/dL   LDL Chol Calc (NIH) 093 (H) 0 - 99 mg/dL  Microalbumin, Urine Waived  Result Value Ref Range   Microalb, Ur Waived 30 (H) 0 - 19 mg/L   Creatinine, Urine Waived 100 10 - 300 mg/dL   Microalb/Creat Ratio <30 <30 mg/g  TSH  Result Value Ref Range   TSH  2.040 0.450 - 4.500 uIU/mL  Urinalysis, Routine w reflex microscopic  Result Value Ref Range   Specific Gravity, UA 1.020 1.005 - 1.030   pH,  UA 6.5 5.0 - 7.5   Color, UA Yellow Yellow   Appearance Ur Clear Clear   Leukocytes,UA Negative Negative   Protein,UA Negative Negative/Trace   Glucose, UA 1+ (A) Negative   Ketones, UA Trace (A) Negative   RBC, UA Negative Negative   Bilirubin, UA Negative Negative   Urobilinogen, Ur 0.2 0.2 - 1.0 mg/dL   Nitrite, UA Negative Negative  Hepatitis C Antibody  Result Value Ref Range   Hep C Virus Ab <0.1 0.0 - 0.9 s/co ratio      Assessment & Plan:   Problem List Items Addressed This Visit      Endocrine   Diabetes mellitus type 2, uncomplicated (HCC) - Primary    Tolerating her medicine well. Feeling well. Continue current regimen. Recheck end of October. Call with any concerns.       Relevant Orders   Basic metabolic panel       Follow up plan: Return end of october DM.

## 2020-04-28 NOTE — Assessment & Plan Note (Signed)
Tolerating her medicine well. Feeling well. Continue current regimen. Recheck end of October. Call with any concerns.

## 2020-04-28 NOTE — Patient Instructions (Signed)
Hip Exercises Ask your health care provider which exercises are safe for you. Do exercises exactly as told by your health care provider and adjust them as directed. It is normal to feel mild stretching, pulling, tightness, or discomfort as you do these exercises. Stop right away if you feel sudden pain or your pain gets worse. Do not begin these exercises until told by your health care provider. Stretching and range-of-motion exercises These exercises warm up your muscles and joints and improve the movement and flexibility of your hip. These exercises also help to relieve pain, numbness, and tingling. You may be asked to limit your range of motion if you had a hip replacement. Talk to your health care provider about these restrictions. Hamstrings, supine  1. Lie on your back (supine position). 2. Loop a belt or towel over the ball of your left / right foot. The ball of your foot is on the walking surface, right under your toes. 3. Straighten your left / right knee and slowly pull on the belt or towel to raise your leg until you feel a gentle stretch behind your knee (hamstring). ? Do not let your knee bend while you do this. ? Keep your other leg flat on the floor. 4. Hold this position for __________ seconds. 5. Slowly return your leg to the starting position. Repeat __________ times. Complete this exercise __________ times a day. Hip rotation  1. Lie on your back on a firm surface. 2. With your left / right hand, gently pull your left / right knee toward the shoulder that is on the same side of the body. Stop when your knee is pointing toward the ceiling. 3. Hold your left / right ankle with your other hand. 4. Keeping your knee steady, gently pull your left / right ankle toward your other shoulder until you feel a stretch in your buttocks. ? Keep your hips and shoulders firmly planted while you do this stretch. 5. Hold this position for __________ seconds. Repeat __________ times. Complete  this exercise __________ times a day. Seated stretch This exercise is sometimes called hamstrings and adductors stretch. 1. Sit on the floor with your legs stretched wide. Keep your knees straight during this exercise. 2. Keeping your head and back in a straight line, bend at your waist to reach for your left foot (position A). You should feel a stretch in your right inner thigh (adductors). 3. Hold this position for __________ seconds. Then slowly return to the upright position. 4. Keeping your head and back in a straight line, bend at your waist to reach forward (position B). You should feel a stretch behind both of your thighs and knees (hamstrings). 5. Hold this position for __________ seconds. Then slowly return to the upright position. 6. Keeping your head and back in a straight line, bend at your waist to reach for your right foot (position C). You should feel a stretch in your left inner thigh (adductors). 7. Hold this position for __________ seconds. Then slowly return to the upright position. Repeat __________ times. Complete this exercise __________ times a day. Lunge This exercise stretches the muscles of the hip (hip flexors). 1. Place your left / right knee on the floor and bend your other knee so that is directly over your ankle. You should be half-kneeling. 2. Keep good posture with your head over your shoulders. 3. Tighten your buttocks to point your tailbone downward. This will prevent your back from arching too much. 4. You should feel a  gentle stretch in the front of your left / right thigh and hip. If you do not feel a stretch, slide your other foot forward slightly and then slowly lunge forward with your chest up until your knee once again lines up over your ankle. ? Make sure your tailbone continues to point downward. 5. Hold this position for __________ seconds. 6. Slowly return to the starting position. Repeat __________ times. Complete this exercise __________ times a  day. Strengthening exercises These exercises build strength and endurance in your hip. Endurance is the ability to use your muscles for a long time, even after they get tired. Bridge This exercise strengthens the muscles of your hip (hip extensors). 1. Lie on your back on a firm surface with your knees bent and your feet flat on the floor. 2. Tighten your buttocks muscles and lift your bottom off the floor until the trunk of your body and your hips are level with your thighs. ? Do not arch your back. ? You should feel the muscles working in your buttocks and the back of your thighs. If you do not feel these muscles, slide your feet 1-2 inches (2.5-5 cm) farther away from your buttocks. 3. Hold this position for __________ seconds. 4. Slowly lower your hips to the starting position. 5. Let your muscles relax completely between repetitions. Repeat __________ times. Complete this exercise __________ times a day. Straight leg raises, side-lying This exercise strengthens the muscles that move the hip joint away from the center of the body (hip abductors). 1. Lie on your side with your left / right leg in the top position. Lie so your head, shoulder, hip, and knee line up. You may bend your bottom knee slightly to help you balance. 2. Roll your hips slightly forward, so your hips are stacked directly over each other and your left / right knee is facing forward. 3. Leading with your heel, lift your top leg 4-6 inches (10-15 cm). You should feel the muscles in your top hip lifting. ? Do not let your foot drift forward. ? Do not let your knee roll toward the ceiling. 4. Hold this position for __________ seconds. 5. Slowly return to the starting position. 6. Let your muscles relax completely between repetitions. Repeat __________ times. Complete this exercise __________ times a day. Straight leg raises, side-lying This exercise strengthens the muscles that move the hip joint toward the center of the  body (hip adductors). 1. Lie on your side with your left / right leg in the bottom position. Lie so your head, shoulder, hip, and knee line up. You may place your upper foot in front to help you balance. 2. Roll your hips slightly forward, so your hips are stacked directly over each other and your left / right knee is facing forward. 3. Tense the muscles in your inner thigh and lift your bottom leg 4-6 inches (10-15 cm). 4. Hold this position for __________ seconds. 5. Slowly return to the starting position. 6. Let your muscles relax completely between repetitions. Repeat __________ times. Complete this exercise __________ times a day. Straight leg raises, supine This exercise strengthens the muscles in the front of your thigh (quadriceps). 1. Lie on your back (supine position) with your left / right leg extended and your other knee bent. 2. Tense the muscles in the front of your left / right thigh. You should see your kneecap slide up or see increased dimpling just above your knee. 3. Keep these muscles tight as you raise your   leg 4-6 inches (10-15 cm) off the floor. Do not let your knee bend. 4. Hold this position for __________ seconds. 5. Keep these muscles tense as you lower your leg. 6. Relax the muscles slowly and completely between repetitions. Repeat __________ times. Complete this exercise __________ times a day. Hip abductors, standing This exercise strengthens the muscles that move the leg and hip joint away from the center of the body (hip abductors). 1. Tie one end of a rubber exercise band or tubing to a secure surface, such as a chair, table, or pole. 2. Loop the other end of the band or tubing around your left / right ankle. 3. Keeping your ankle with the band or tubing directly opposite the secured end, step away until there is tension in the tubing or band. Hold on to a chair, table, or pole as needed for balance. 4. Lift your left / right leg out to your side. While you do  this: ? Keep your back upright. ? Keep your shoulders over your hips. ? Keep your toes pointing forward. ? Make sure to use your hip muscles to slowly lift your leg. Do not tip your body or forcefully lift your leg. 5. Hold this position for __________ seconds. 6. Slowly return to the starting position. Repeat __________ times. Complete this exercise __________ times a day. Squats This exercise strengthens the muscles in the front of your thigh (quadriceps). 1. Stand in a door frame so your feet and knees are in line with the frame. You may place your hands on the frame for balance. 2. Slowly bend your knees and lower your hips like you are going to sit in a chair. ? Keep your lower legs in a straight-up-and-down position. ? Do not let your hips go lower than your knees. ? Do not bend your knees lower than told by your health care provider. ? If your hip pain increases, do not bend as low. 3. Hold this position for ___________ seconds. 4. Slowly push with your legs to return to standing. Do not use your hands to pull yourself to standing. Repeat __________ times. Complete this exercise __________ times a day. This information is not intended to replace advice given to you by your health care provider. Make sure you discuss any questions you have with your health care provider. Document Revised: 03/19/2019 Document Reviewed: 06/24/2018 [employer] Patient Education  [place] or Strain Rehab Ask your health care provider which exercises are safe for you. Do exercises exactly as told by your health care provider and adjust them as directed. It is normal to feel mild stretching, pulling, tightness, or discomfort as you do these exercises. Stop right away if you feel sudden pain or your pain gets worse. Do not begin these exercises until told by your health care provider. Stretching and range-of-motion exercises These exercises warm up your muscles and joints and improve  the movement and flexibility of your back. These exercises also help to relieve pain, numbness, and tingling. Lumbar rotation  6. Lie on your back on a firm surface and bend your knees. 7. Straighten your arms out to your sides so each arm forms a 90-degree angle (right angle) with a side of your body. 8. Slowly move (rotate) both of your knees to one side of your body until you feel a stretch in your lower back (lumbar). Try not to let your shoulders lift off the floor. 9. Hold this position for __________ seconds. 10. Tense  your abdominal muscles and slowly move your knees back to the starting position. 11. Repeat this exercise on the other side of your body. Repeat __________ times. Complete this exercise __________ times a day. Single knee to chest  6. Lie on your back on a firm surface with both legs straight. 7. Bend one of your knees. Use your hands to move your knee up toward your chest until you feel a gentle stretch in your lower back and buttock. ? Hold your leg in this position by holding on to the front of your knee. ? Keep your other leg as straight as possible. 8. Hold this position for __________ seconds. 9. Slowly return to the starting position. 10. Repeat with your other leg. Repeat __________ times. Complete this exercise __________ times a day. Prone extension on elbows  8. Lie on your abdomen on a firm surface (prone position). 9. Prop yourself up on your elbows. 10. Use your arms to help lift your chest up until you feel a gentle stretch in your abdomen and your lower back. ? This will place some of your body weight on your elbows. If this is uncomfortable, try stacking pillows under your chest. ? Your hips should stay down, against the surface that you are lying on. Keep your hip and back muscles relaxed. 11. Hold this position for __________ seconds. 12. Slowly relax your upper body and return to the starting position. Repeat __________ times. Complete this  exercise __________ times a day. Strengthening exercises These exercises build strength and endurance in your back. Endurance is the ability to use your muscles for a long time, even after they get tired. Pelvic tilt This exercise strengthens the muscles that lie deep in the abdomen. 7. Lie on your back on a firm surface. Bend your knees and keep your feet flat on the floor. 8. Tense your abdominal muscles. Tip your pelvis up toward the ceiling and flatten your lower back into the floor. ? To help with this exercise, you may place a small towel under your lower back and try to push your back into the towel. 9. Hold this position for __________ seconds. 10. Let your muscles relax completely before you repeat this exercise. Repeat __________ times. Complete this exercise __________ times a day. Alternating arm and leg raises  6. Get on your hands and knees on a firm surface. If you are on a hard floor, you may want to use padding, such as an exercise mat, to cushion your knees. 7. Line up your arms and legs. Your hands should be directly below your shoulders, and your knees should be directly below your hips. 8. Lift your left leg behind you. At the same time, raise your right arm and straighten it in front of you. ? Do not lift your leg higher than your hip. ? Do not lift your arm higher than your shoulder. ? Keep your abdominal and back muscles tight. ? Keep your hips facing the ground. ? Do not arch your back. ? Keep your balance carefully, and do not hold your breath. 9. Hold this position for __________ seconds. 10. Slowly return to the starting position. 11. Repeat with your right leg and your left arm. Repeat __________ times. Complete this exercise __________ times a day. Abdominal set with straight leg raise  7. Lie on your back on a firm surface. 8. Bend one of your knees and keep your other leg straight. 9. Tense your abdominal muscles and lift your straight leg up, 4-6  inches  (10-15 cm) off the ground. 10. Keep your abdominal muscles tight and hold this position for __________ seconds. ? Do not hold your breath. ? Do not arch your back. Keep it flat against the ground. 11. Keep your abdominal muscles tense as you slowly lower your leg back to the starting position. 12. Repeat with your other leg. Repeat __________ times. Complete this exercise __________ times a day. Single leg lower with bent knees 7. Lie on your back on a firm surface. 8. Tense your abdominal muscles and lift your feet off the floor, one foot at a time, so your knees and hips are bent in 90-degree angles (right angles). ? Your knees should be over your hips and your lower legs should be parallel to the floor. 9. Keeping your abdominal muscles tense and your knee bent, slowly lower one of your legs so your toe touches the ground. 10. Lift your leg back up to return to the starting position. ? Do not hold your breath. ? Do not let your back arch. Keep your back flat against the ground. 11. Repeat with your other leg. Repeat __________ times. Complete this exercise __________ times a day. Posture and body mechanics Good posture and healthy body mechanics can help to relieve stress in your body's tissues and joints. Body mechanics refers to the movements and positions of your body while you do your daily activities. Posture is part of body mechanics. Good posture means:  Your spine is in its natural S-curve position (neutral).  Your shoulders are pulled back slightly.  Your head is not tipped forward. Follow these guidelines to improve your posture and body mechanics in your everyday activities. Standing   When standing, keep your spine neutral and your feet about hip width apart. Keep a slight bend in your knees. Your ears, shoulders, and hips should line up.  When you do a task in which you stand in one place for a long time, place one foot up on a stable object that is 2-4 inches (5-10 cm)  high, such as a footstool. This helps keep your spine neutral. Sitting   When sitting, keep your spine neutral and keep your feet flat on the floor. Use a footrest, if necessary, and keep your thighs parallel to the floor. Avoid rounding your shoulders, and avoid tilting your head forward.  When working at a desk or a computer, keep your desk at a height where your hands are slightly lower than your elbows. Slide your chair under your desk so you are close enough to maintain good posture.  When working at a computer, place your monitor at a height where you are looking straight ahead and you do not have to tilt your head forward or downward to look at the screen. Resting  When lying down and resting, avoid positions that are most painful for you.  If you have pain with activities such as sitting, bending, stooping, or squatting, lie in a position in which your body does not bend very much. For example, avoid curling up on your side with your arms and knees near your chest (fetal position).  If you have pain with activities such as standing for a long time or reaching with your arms, lie with your spine in a neutral position and bend your knees slightly. Try the following positions: ? Lying on your side with a pillow between your knees. ? Lying on your back with a pillow under your knees. Lifting   When lifting objects,  keep your feet at least shoulder width apart and tighten your abdominal muscles.  Bend your knees and hips and keep your spine neutral. It is important to lift using the strength of your legs, not your back. Do not lock your knees straight out.  Always ask for help to lift heavy or awkward objects. This information is not intended to replace advice given to you by your health care provider. Make sure you discuss any questions you have with your health care provider. Document Revised: 12/05/2018 Document Reviewed: 09/04/2018 [employer] Patient Education  2020 [employer].

## 2020-04-29 LAB — BASIC METABOLIC PANEL
BUN/Creatinine Ratio: 18 (ref 9–23)
BUN: 13 mg/dL (ref 6–20)
CO2: 23 mmol/L (ref 20–29)
Calcium: 9.1 mg/dL (ref 8.7–10.2)
Chloride: 102 mmol/L (ref 96–106)
Creatinine, Ser: 0.73 mg/dL (ref 0.57–1.00)
GFR calc Af Amer: 120 mL/min/{1.73_m2} (ref >59)
GFR calc non Af Amer: 104 mL/min/{1.73_m2} (ref >59)
Glucose: 190 mg/dL — ABNORMAL HIGH (ref 65–99)
Potassium: 4.2 mmol/L (ref 3.5–5.2)
Sodium: 137 mmol/L (ref 134–144)

## 2020-05-17 ENCOUNTER — Telehealth (INDEPENDENT_AMBULATORY_CARE_PROVIDER_SITE_OTHER): Payer: BC Managed Care – PPO | Admitting: Nurse Practitioner

## 2020-05-17 ENCOUNTER — Other Ambulatory Visit: Payer: Self-pay

## 2020-05-17 ENCOUNTER — Encounter: Payer: Self-pay | Admitting: Nurse Practitioner

## 2020-05-17 VITALS — Temp 98.4°F

## 2020-05-17 DIAGNOSIS — J069 Acute upper respiratory infection, unspecified: Secondary | ICD-10-CM | POA: Diagnosis not present

## 2020-05-17 MED ORDER — BENZONATATE 100 MG PO CAPS
100.0000 mg | ORAL_CAPSULE | Freq: Two times a day (BID) | ORAL | 0 refills | Status: DC | PRN
Start: 2020-05-17 — End: 2020-07-14

## 2020-05-17 MED ORDER — DEXTROMETHORPHAN HBR 15 MG/5ML PO SYRP
10.0000 mL | ORAL_SOLUTION | Freq: Three times a day (TID) | ORAL | 0 refills | Status: DC | PRN
Start: 2020-05-17 — End: 2020-07-14

## 2020-05-17 NOTE — Patient Instructions (Signed)

## 2020-05-17 NOTE — Progress Notes (Signed)
Temp 98.4 F (36.9 C) (Oral)   LMP 04/27/2020 (Exact Date)    Subjective:    Patient ID: Mary Drake, female    DOB: 1980-11-10, 39 y.o.   MRN: [id number]  HPI: Mary Drake is a 39 y.o. female presenting with upper respiratory symptoms.  Chief Complaint  Patient presents with  . Sinus Problem    nasal congestion, coughing, productive cough, and runny nose x 3 days. Pt had a negative COVID test on yesterday.    UPPER RESPIRATORY TRACT INFECTION Onset: 3 days ago COVID test negative 9/20.  Woke up this morning and is feeling a lot better.  Worst symptom: coughing Fever: no  Chills: yes Cough: yes Shortness of breath: no  Cigarette smoking: no Wheezing: no Chest pain: no Chest tightness: no Chest congestion: yes Nasal congestion: yes Runny nose: yes Post nasal drip: yes Sneezing: no Sore throat: no Swollen glands: no Sinus pressure: yes Headache: yes Face pain: no Toothache: no Ear pain: no  Ear pressure: no  Eyes red/itching:no Eye drainage/crusting: no  Nausea: no Vomiting: no  Appetite changes: no Rash: no Fatigue: yes  Sick contacts: yes; several people have had URI in family Strep contacts: no  Context: better Recurrent sinusitis: no Relief with OTC cold/cough medications: yes  Treatments attempted: cough drops, increasing fluid intake,  Robitussin Max cough, vicks nasal spray at night  Allergies  Allergen Reactions  . Codeine    Outpatient Encounter Medications as of 05/17/2020  Medication Sig  . azelastine (ASTELIN) 0.1 % nasal spray Place 2 sprays into both nostrils 2 (two) times daily. Use in each nostril as directed  . cetirizine (ZYRTEC) 10 MG tablet Take 10 mg by mouth daily.  . empagliflozin (JARDIANCE) 25 MG TABS tablet Take 1 tablet (25 mg total) by mouth daily before breakfast.  . guaifenesin (ROBITUSSIN) 100 MG/5ML syrup Take 200 mg by mouth 3 (three) times daily as needed for cough.  . metFORMIN (GLUCOPHAGE) 500 MG  tablet Take 2 tablets (1,000 mg total) by mouth 2 (two) times daily with a meal.  . nortriptyline (PAMELOR) 25 MG capsule Take 1 capsule (25 mg total) by mouth at bedtime.  Marland Kitchen TALTZ 80 MG/ML SOAJ   . benzonatate (TESSALON) 100 MG capsule Take 1 capsule (100 mg total) by mouth 2 (two) times daily as needed for cough.  . dextromethorphan 15 MG/5ML syrup Take 10 mLs (30 mg total) by mouth 3 (three) times daily as needed for cough.  . nystatin (MYCOSTATIN/NYSTOP) powder Apply 1 application topically 3 (three) times daily. (Patient not taking: Reported on 05/17/2020)   No facility-administered encounter medications on file as of 05/17/2020.   Patient Active Problem List   Diagnosis Date Noted  . Upper respiratory tract infection 05/17/2020  . Migraines 07/01/2019  . 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 Diabetes mellitus type 2, uncomplicated (HCC) 08/02/2016   Past Medical History:  Diagnosis Date  . Migraines   . Psoriasis    Relevant past medical, surgical, family and social history reviewed and updated as indicated. Interim medical history since our last visit reviewed.  Review of Systems  Constitutional: Positive for chills and fatigue. Negative for activity change, appetite change and fever.  HENT: Positive for congestion, postnasal drip, rhinorrhea, sinus pressure and sneezing. Negative for ear discharge, ear pain, sinus pain, sore throat, tinnitus and trouble swallowing.   Eyes: Negative.  Negative for pain, discharge, redness and itching.  Respiratory: Positive for cough. Negative for chest  tightness, shortness of breath and wheezing.   Cardiovascular: Negative.  Negative for chest pain.  Gastrointestinal: Negative.  Negative for diarrhea, nausea and vomiting.  Genitourinary: Negative.   Musculoskeletal: Negative.   Skin: Negative.  Negative for rash.  Hematological: Negative.  Negative for adenopathy.  Psychiatric/Behavioral: Negative.     Per HPI unless specifically  indicated above     Objective:    Temp 98.4 F (36.9 C) (Oral)   LMP 04/27/2020 (Exact Date)   Wt Readings from Last 3 Encounters:  04/28/20 260 lb (117.9 kg)  03/21/20 (!) 265 lb (120.2 kg)  11/30/19 254 lb (115.2 kg)    Physical Exam Vitals and nursing note reviewed.  Constitutional:      General: She is not in acute distress.    Appearance: Normal appearance. She is not toxic-appearing.  HENT:     Head: Normocephalic and atraumatic.     Right Ear: External ear normal.     Left Ear: External ear normal.     Nose: Rhinorrhea present. No congestion.     Mouth/Throat:     Mouth: Mucous membranes are moist.     Pharynx: Oropharynx is clear.  Eyes:     General: No scleral icterus.       Right eye: No discharge.        Left eye: No discharge.     Extraocular Movements: Extraocular movements intact.  Cardiovascular:     Comments: Unable to assess heart sounds via virtual visit Pulmonary:     Effort: Pulmonary effort is normal. No respiratory distress.     Comments: Unable to assess lung sounds via virtual visit; speaking in complete sentences Abdominal:     Comments: Unable to assess bowel sounds via virtual visit  Skin:    Coloration: Skin is not jaundiced or pale.     Findings: No erythema.  Neurological:     General: No focal deficit present.     Mental Status: She is alert and oriented to person, place, and time.  Psychiatric:        Mood and Affect: Mood normal.        Behavior: Behavior normal.        Thought Content: Thought content normal.        Judgment: Judgment normal.       Assessment & Plan:   Problem List Items Addressed This Visit      Respiratory   Upper respiratory tract infection - Primary    Acute, ongoing.  COVID testing negative yesterday.  Likely viral in etiology given time from symptom onset = 3 days.  Will continue supportive care with increased hydration, continued expectorant and start cough suppressant.  If not better by middle of next  week, consider antibiotic.           Follow up plan: No follow-ups on file.  Due to the catastrophic nature of the COVID-19 pandemic, this visit was completed via audio and visual contact via Mychart due to the restrictions of the COVID-19 pandemic. All issues as above were discussed and addressed. Physical exam was done as above through visual confirmation on Mychart. If it was felt that the patient should be evaluated in the office, they were directed there. The patient verbally consented to this visit.  The visit was transitioned from a Mychart to telephone visit mid-way through due to equipment malfunctioning. . Location of the patient: home . Location of the provider: work . Those involved with this call:  . Provider: Shanda Bumps  Denzil Magnuson, DNP . CMA: Laurel Dimmer, CMA . Front Desk/Registration: Adela Ports  . Time spent on call: 15 minutes with patient face to face via video conference. More than 50% of this time was spent in counseling and coordination of care. 30 minutes total spent in review of patient's record and preparation of their chart.  I verified patient identity using two factors (patient name and date of birth). Patient consents verbally to being seen via telemedicine visit today.

## 2020-05-17 NOTE — Assessment & Plan Note (Signed)
Acute, ongoing.  COVID testing negative yesterday.  Likely viral in etiology given time from symptom onset = 3 days.  Will continue supportive care with increased hydration, continued expectorant and start cough suppressant.  If not better by middle of next week, consider antibiotic.

## 2020-05-18 ENCOUNTER — Telehealth: Payer: BC Managed Care – PPO | Admitting: Unknown Physician Specialty

## 2020-05-26 ENCOUNTER — Ambulatory Visit: Payer: BC Managed Care – PPO | Admitting: Nurse Practitioner

## 2020-06-23 ENCOUNTER — Ambulatory Visit: Payer: BC Managed Care – PPO | Admitting: Family Medicine

## 2020-07-04 ENCOUNTER — Ambulatory Visit: Payer: BC Managed Care – PPO | Admitting: Family Medicine

## 2020-07-14 ENCOUNTER — Encounter: Payer: Self-pay | Admitting: Family Medicine

## 2020-07-14 ENCOUNTER — Ambulatory Visit (INDEPENDENT_AMBULATORY_CARE_PROVIDER_SITE_OTHER): Payer: BC Managed Care – PPO | Admitting: Family Medicine

## 2020-07-14 ENCOUNTER — Other Ambulatory Visit: Payer: Self-pay

## 2020-07-14 VITALS — BP 122/82 | HR 73 | Temp 98.4°F | Ht 62.0 in | Wt 256.0 lb

## 2020-07-14 DIAGNOSIS — K0889 Other specified disorders of teeth and supporting structures: Secondary | ICD-10-CM | POA: Diagnosis not present

## 2020-07-14 DIAGNOSIS — E119 Type 2 diabetes mellitus without complications: Secondary | ICD-10-CM

## 2020-07-14 LAB — BAYER DCA HB A1C WAIVED: HB A1C (BAYER DCA - WAIVED): 8.9 % — ABNORMAL HIGH (ref <7.0)

## 2020-07-14 MED ORDER — EMPAGLIFLOZIN 25 MG PO TABS
25.0000 mg | ORAL_TABLET | Freq: Every day | ORAL | 1 refills | Status: DC
Start: 2020-07-14 — End: 2021-02-02

## 2020-07-14 MED ORDER — METFORMIN HCL 500 MG PO TABS: 1000.0000 mg | ORAL_TABLET | Freq: Two times a day (BID) | ORAL | 1 refills | Status: DC

## 2020-07-14 MED ORDER — AMOXICILLIN-POT CLAVULANATE 875-125 MG PO TABS: 1.0000 | ORAL_TABLET | Freq: Two times a day (BID) | ORAL | 0 refills | Status: DC

## 2020-07-14 NOTE — Assessment & Plan Note (Signed)
Stable with A1c of 8.9. Coming off nights- would like to really work on diet and exercise prior to adding medicine. Will recheck in 3 months, if still high, will start trulicity or ozempic.

## 2020-07-14 NOTE — Progress Notes (Signed)
BP 122/82   Pulse 73   Temp 98.4 F (36.9 C)   Ht 5\' 2"  (1.575 m)   Wt 256 lb (116.1 kg)   SpO2 96%   BMI 46.82 kg/m    Subjective:    Patient ID: , female    DOB: 02-17-1981, 39 y.o.   MRN: [id number]  HPI: Mary Drake is a 39 y.o. female  Chief Complaint  Patient presents with  . Diabetes   DIABETES Hypoglycemic episodes:no Polydipsia/polyuria: no Visual disturbance: no Chest pain: no Paresthesias: no Glucose Monitoring: no  Accucheck frequency: Not Checking  Fasting glucose: Taking Insulin?: no Blood Pressure Monitoring: not checking Retinal Examination: Up to Date Foot Exam: Up to Date Diabetic Education: Completed Pneumovax: Up to Date Influenza: Up to Date Aspirin: no  EAR PAIN Duration: couple of days- has issue with her tooth Involved ear(s): left Severity:  moderate  Quality:  aching Fever: no Otorrhea: no Upper respiratory infection symptoms: no Pruritus: no Hearing loss: no Water immersion no Using Q-tips: no Recurrent otitis media: no Status: stable Treatments attempted: none   Relevant past medical, surgical, family and social history reviewed and updated as indicated. Interim medical history since our last visit reviewed. Allergies and medications reviewed and updated.  Review of Systems  Constitutional: Negative.   Respiratory: Negative.   Cardiovascular: Negative.   Gastrointestinal: Negative.   Musculoskeletal: Negative.   Neurological: Negative.   Psychiatric/Behavioral: Negative.     Per HPI unless specifically indicated above     Objective:    BP 122/82   Pulse 73   Temp 98.4 F (36.9 C)   Ht 5\' 2"  (1.575 m)   Wt 256 lb (116.1 kg)   SpO2 96%   BMI 46.82 kg/m   Wt Readings from Last 3 Encounters:  07/14/20 256 lb (116.1 kg)  04/28/20 260 lb (117.9 kg)  03/21/20 (!) 265 lb (120.2 kg)    Physical Exam Vitals and nursing note reviewed.  Constitutional:      General: She is not in  acute distress.    Appearance: Normal appearance. She is not ill-appearing, toxic-appearing or diaphoretic.  HENT:     Head: Normocephalic and atraumatic.     Right Ear: External ear normal.     Left Ear: External ear normal.     Nose: Nose normal.     Mouth/Throat:     Mouth: Mucous membranes are moist.     Pharynx: Oropharynx is clear.  Eyes:     General: No scleral icterus.       Right eye: No discharge.        Left eye: No discharge.     Extraocular Movements: Extraocular movements intact.     Conjunctiva/sclera: Conjunctivae normal.     Pupils: Pupils are equal, round, and reactive to light.  Cardiovascular:     Rate and Rhythm: Normal rate and regular rhythm.     Pulses: Normal pulses.     Heart sounds: Normal heart sounds. No murmur heard.  No friction rub. No gallop.   Pulmonary:     Effort: Pulmonary effort is normal. No respiratory distress.     Breath sounds: Normal breath sounds. No stridor. No wheezing, rhonchi or rales.  Chest:     Chest wall: No tenderness.  Musculoskeletal:        General: Normal range of motion.     Cervical back: Normal range of motion and neck supple.  Skin:    General: Skin is  warm and dry.     Capillary Refill: Capillary refill takes less than 2 seconds.     Coloration: Skin is not jaundiced or pale.     Findings: No bruising, erythema, lesion or rash.  Neurological:     General: No focal deficit present.     Mental Status: She is alert and oriented to person, place, and time. Mental status is at baseline.  Psychiatric:        Mood and Affect: Mood normal.        Behavior: Behavior normal.        Thought Content: Thought content normal.        Judgment: Judgment normal.     Results for orders placed or performed in visit on 04/28/20  Basic metabolic panel  Result Value Ref Range   Glucose 190 (H) 65 - 99 mg/dL   BUN 13 6 - 20 mg/dL   Creatinine, Ser 2.95 0.57 - 1.00 mg/dL   GFR calc non Af Amer 104 >59 mL/min/1.73   GFR calc  Af Amer 120 >59 mL/min/1.73   BUN/Creatinine Ratio 18 9 - 23   Sodium 137 134 - 144 mmol/L   Potassium 4.2 3.5 - 5.2 mmol/L   Chloride 102 96 - 106 mmol/L   CO2 23 20 - 29 mmol/L   Calcium 9.1 8.7 - 10.2 mg/dL      Assessment & Plan:   Problem List Items Addressed This Visit      Endocrine   Diabetes mellitus type 2, uncomplicated (HCC) - Primary    Stable with A1c of 8.9. Coming off nights- would like to really work on diet and exercise prior to adding medicine. Will recheck in 3 months, if still high, will start trulicity or ozempic.       Relevant Medications   metFORMIN (GLUCOPHAGE) 500 MG tablet   empagliflozin (JARDIANCE) 25 MG TABS tablet   Other Relevant Orders   Bayer DCA Hb A1c Waived    Other Visit Diagnoses    Pain, dental       Will treat with augmentin. Encouraged follow up with dentistry. Call with any concerns.        Follow up plan: Return in about 3 months (around 10/14/2020).

## 2020-09-26 ENCOUNTER — Other Ambulatory Visit: Payer: Self-pay

## 2020-09-26 ENCOUNTER — Encounter: Payer: Self-pay | Admitting: Family Medicine

## 2020-09-26 ENCOUNTER — Ambulatory Visit: Payer: BC Managed Care – PPO | Admitting: Family Medicine

## 2020-09-26 VITALS — BP 135/86 | HR 77 | Temp 98.7°F | Wt 254.0 lb

## 2020-09-26 DIAGNOSIS — G43109 Migraine with aura, not intractable, without status migrainosus: Secondary | ICD-10-CM

## 2020-09-26 MED ORDER — NORTRIPTYLINE HCL 25 MG PO CAPS
25.0000 mg | ORAL_CAPSULE | Freq: Every day | ORAL | 1 refills | Status: DC
Start: 2020-09-26 — End: 2021-02-02

## 2020-09-26 NOTE — Assessment & Plan Note (Signed)
Under good control on current regimen. Continue current regimen. Continue to monitor. Call with any concerns. Refills given- can take 2 if needed. Rx changed to reflect this. FMLA paperwork filled out.

## 2020-09-26 NOTE — Progress Notes (Signed)
BP 135/86   Pulse 77   Temp 98.7 F (37.1 C)   Wt 254 lb (115.2 kg) Comment: Patient reported  SpO2 97%   BMI 46.46 kg/m    Subjective:    Patient ID: Casimer Lanius, female    DOB: 1981/06/02, 40 y.o.   MRN: [id number]  HPI: DEOSHA WERDEN is a 40 y.o. female  Chief Complaint  Patient presents with  . Migraine   MIGRAINES- having rare headaches where she needs to be out of work. Maybe about 2 a month, seems to be when she is wearing PPE multiple days in a row.  Duration: chronic Onset: sudden Severity: severe Quality: sharp Frequency: 1-2 a month, but will go stretches without them Location: behind her eye Headache duration: hours Radiation: no Time of day headache occurs: at random Headache status at time of visit: asymptomatic Treatments attempted: Treatments attempted:  rest, ice, heat, APAP, ibuprofen, aleve", excedrine, triptans and amitriptyline      Aura: yes Nausea:  yes Vomiting: yes Photophobia:  yes Phonophobia:  yes Effect on social functioning:  yes Numbers of missed days of school/work each month: 1-3 Confusion:  no Gait disturbance/ataxia:  no Behavioral changes:  no Fevers:  no  Relevant past medical, surgical, family and social history reviewed and updated as indicated. Interim medical history since our last visit reviewed. Allergies and medications reviewed and updated.  Review of Systems  Constitutional: Negative.   Respiratory: Negative.   Cardiovascular: Negative.   Gastrointestinal: Negative.   Musculoskeletal: Negative.   Psychiatric/Behavioral: Negative.     Per HPI unless specifically indicated above     Objective:    BP 135/86   Pulse 77   Temp 98.7 F (37.1 C)   Wt 254 lb (115.2 kg) Comment: Patient reported  SpO2 97%   BMI 46.46 kg/m   Wt Readings from Last 3 Encounters:  09/26/20 254 lb (115.2 kg)  07/14/20 256 lb (116.1 kg)  04/28/20 260 lb (117.9 kg)    Physical Exam Vitals and nursing note  reviewed.  Constitutional:      General: She is not in acute distress.    Appearance: Normal appearance. She is not ill-appearing, toxic-appearing or diaphoretic.  HENT:     Head: Normocephalic and atraumatic.     Right Ear: External ear normal.     Left Ear: External ear normal.     Nose: Nose normal.     Mouth/Throat:     Mouth: Mucous membranes are moist.     Pharynx: Oropharynx is clear.  Eyes:     General: No scleral icterus.       Right eye: No discharge.        Left eye: No discharge.     Extraocular Movements: Extraocular movements intact.     Conjunctiva/sclera: Conjunctivae normal.     Pupils: Pupils are equal, round, and reactive to light.  Cardiovascular:     Rate and Rhythm: Normal rate and regular rhythm.     Pulses: Normal pulses.     Heart sounds: Normal heart sounds. No murmur heard. No friction rub. No gallop.   Pulmonary:     Effort: Pulmonary effort is normal. No respiratory distress.     Breath sounds: Normal breath sounds. No stridor. No wheezing, rhonchi or rales.  Chest:     Chest wall: No tenderness.  Musculoskeletal:        General: Normal range of motion.     Cervical back: Normal range of motion  and neck supple.  Skin:    General: Skin is warm and dry.     Capillary Refill: Capillary refill takes less than 2 seconds.     Coloration: Skin is not jaundiced or pale.     Findings: No bruising, erythema, lesion or rash.  Neurological:     General: No focal deficit present.     Mental Status: She is alert and oriented to person, place, and time. Mental status is at baseline.  Psychiatric:        Mood and Affect: Mood normal.        Behavior: Behavior normal.        Thought Content: Thought content normal.        Judgment: Judgment normal.     Results for orders placed or performed in visit on 07/14/20  Bayer DCA Hb A1c Waived  Result Value Ref Range   HB A1C (BAYER DCA - WAIVED) 8.9 (H) <7.0 %      Assessment & Plan:   Problem List Items  Addressed This Visit      Cardiovascular and Mediastinum   Migraines - Primary    Under good control on current regimen. Continue current regimen. Continue to monitor. Call with any concerns. Refills given- can take 2 if needed. Rx changed to reflect this. FMLA paperwork filled out.        Relevant Medications   nortriptyline (PAMELOR) 25 MG capsule       Follow up plan: Return As scheduled.

## 2020-10-14 ENCOUNTER — Ambulatory Visit: Payer: BC Managed Care – PPO | Admitting: Family Medicine

## 2020-10-20 ENCOUNTER — Ambulatory Visit: Payer: BC Managed Care – PPO | Admitting: Family Medicine

## 2020-11-10 ENCOUNTER — Other Ambulatory Visit: Payer: Self-pay | Admitting: Family Medicine

## 2020-11-10 NOTE — Telephone Encounter (Signed)
Requested medication (s) are due for refill today:   Not sure depends on how many she is taking at bedtime.  Requested medication (s) are on the active medication list:   Yes  Future visit scheduled:   Yes in 6 days with Dr. Laural Benes   Last ordered: 09/26/2020 #180, 1 refill  Clinic note:  Returned because pharmacy requesting a 90 day supply #360 with 1 refill   Requested Prescriptions  Pending Prescriptions Disp Refills   nortriptyline (PAMELOR) 25 MG capsule [Pharmacy Med Name: NORTRIPTYLINE HCL 25 MG CAP] 360 capsule 1    Sig: Take 1-4 capsules (25-100 mg total) by mouth at bedtime.      Psychiatry:  Antidepressants - Heterocyclics (TCAs) Passed - 11/10/2020  8:30 AM      Passed - Valid encounter within last 6 months    Recent Outpatient Visits           1 month ago Migraine with aura and without status migrainosus, not intractable   [facility] [place], Megan P, DO   3 months ago Type 2 diabetes mellitus without complication, without long-term current use of insulin (HCC)   [facility] [place], Megan P, DO   5 months ago Upper respiratory tract infection, unspecified type   [facility] [place] A, NP   6 months ago Type 2 diabetes mellitus without complication, without long-term current use of insulin (HCC)   [facility], Megan P, DO   7 months ago Type 2 diabetes mellitus without complication, without long-term current use of insulin (HCC)   [facility] [place], [place], DO       Future Appointments             In 6 days Laural Benes, Oralia Rud, DO [employer]

## 2020-11-10 NOTE — Telephone Encounter (Signed)
FYI

## 2020-11-16 ENCOUNTER — Ambulatory Visit: Payer: BC Managed Care – PPO | Admitting: Family Medicine

## 2020-12-08 ENCOUNTER — Ambulatory Visit: Payer: BC Managed Care – PPO | Admitting: Family Medicine

## 2021-02-02 ENCOUNTER — Encounter: Payer: Self-pay | Admitting: Family Medicine

## 2021-02-02 ENCOUNTER — Ambulatory Visit: Payer: BC Managed Care – PPO | Admitting: Family Medicine

## 2021-02-02 ENCOUNTER — Other Ambulatory Visit: Payer: Self-pay

## 2021-02-02 VITALS — BP 124/80 | HR 89 | Temp 98.1°F | Wt 258.0 lb

## 2021-02-02 DIAGNOSIS — R300 Dysuria: Secondary | ICD-10-CM | POA: Diagnosis not present

## 2021-02-02 DIAGNOSIS — E119 Type 2 diabetes mellitus without complications: Secondary | ICD-10-CM | POA: Diagnosis not present

## 2021-02-02 LAB — MICROSCOPIC EXAMINATION: Bacteria, UA: NONE SEEN

## 2021-02-02 LAB — URINALYSIS, ROUTINE W REFLEX MICROSCOPIC
Bilirubin, UA: NEGATIVE
Ketones, UA: NEGATIVE
Leukocytes,UA: NEGATIVE
Nitrite, UA: NEGATIVE
Specific Gravity, UA: >1.03 — ABNORMAL HIGH (ref 1.005–1.030)
Urobilinogen, Ur: 0.2 mg/dL (ref 0.2–1.0)
pH, UA: 6 (ref 5.0–7.5)

## 2021-02-02 LAB — BAYER DCA HB A1C WAIVED: HB A1C (BAYER DCA - WAIVED): 9.1 % — ABNORMAL HIGH (ref <7.0)

## 2021-02-02 MED ORDER — OZEMPIC (0.25 OR 0.5 MG/DOSE) 2 MG/1.5ML ~~LOC~~ SOPN: PEN_INJECTOR | SUBCUTANEOUS | 0 refills | Status: DC

## 2021-02-02 MED ORDER — METFORMIN HCL 500 MG PO TABS: 1000.0000 mg | ORAL_TABLET | Freq: Two times a day (BID) | ORAL | 1 refills | Status: DC

## 2021-02-02 MED ORDER — PEN NEEDLES 30G X 8 MM MISC: 1.0000 | 3 refills | Status: DC

## 2021-02-02 MED ORDER — EMPAGLIFLOZIN 25 MG PO TABS: 25.0000 mg | ORAL_TABLET | Freq: Every day | ORAL | 1 refills | Status: DC

## 2021-02-02 MED ORDER — NORTRIPTYLINE HCL 25 MG PO CAPS: 25.0000 mg | ORAL_CAPSULE | Freq: Every day | ORAL | 1 refills | Status: DC

## 2021-02-02 NOTE — Progress Notes (Signed)
BP 124/80   Pulse 89   Temp 98.1 F (36.7 C)   Wt 258 lb (117 kg)   SpO2 97%   BMI 47.19 kg/m    Subjective:    Patient ID: Mary Drake, female    DOB: 07/15/1981, 40 y.o.   MRN: [id number]  HPI: Mary Drake is a 40 y.o. female  Chief Complaint  Patient presents with   Diabetes   Urinary Tract Infection    Patient states she is having burning with urination, urinary frequency, and a lot of pressure when using the bathroom. Symptoms seem to be getting better.    DIABETES Hypoglycemic episodes:no Polydipsia/polyuria: no Visual disturbance: no Chest pain: no Paresthesias: no Glucose Monitoring: no  Accucheck frequency: Not Checking Taking Insulin?: no Blood Pressure Monitoring: not checking Retinal Examination: Up to Date Foot Exam: Not up to Date Diabetic Education: Completed Pneumovax: Up to Date Influenza: Up to Date Aspirin: no  URINARY SYMPTOMS Dysuria: yes Urinary frequency: yes Urgency: yes Small volume voids: no Symptom severity: moderate Urinary incontinence: no Foul odor: yes Hematuria: no Abdominal pain: no Back pain: no Suprapubic pain/pressure: yes Flank pain: no Fever:  no Vomiting: no Relief with cranberry juice: no Relief with pyridium: no Status: better/worse/stable Previous urinary tract infection: no Recurrent urinary tract infection: no History of sexually transmitted disease: no Vaginal discharge: no Treatments attempted: increasing fluids    Relevant past medical, surgical, family and social history reviewed and updated as indicated. Interim medical history since our last visit reviewed. Allergies and medications reviewed and updated.  Review of Systems  Constitutional: Negative.   Respiratory: Negative.    Cardiovascular: Negative.   Gastrointestinal: Negative.   Genitourinary:  Positive for dysuria, frequency and urgency. Negative for decreased urine volume, difficulty urinating, dyspareunia, enuresis,  flank pain, genital sores, hematuria, menstrual problem, pelvic pain, vaginal bleeding, vaginal discharge and vaginal pain.  Musculoskeletal: Negative.   Psychiatric/Behavioral: Negative.     Per HPI unless specifically indicated above     Objective:    BP 124/80   Pulse 89   Temp 98.1 F (36.7 C)   Wt 258 lb (117 kg)   SpO2 97%   BMI 47.19 kg/m   Wt Readings from Last 3 Encounters:  02/02/21 258 lb (117 kg)  09/26/20 254 lb (115.2 kg)  07/14/20 256 lb (116.1 kg)    Physical Exam Vitals and nursing note reviewed.  Constitutional:      General: She is not in acute distress.    Appearance: Normal appearance. She is not ill-appearing, toxic-appearing or diaphoretic.  HENT:     Head: Normocephalic and atraumatic.     Right Ear: External ear normal.     Left Ear: External ear normal.     Nose: Nose normal.     Mouth/Throat:     Mouth: Mucous membranes are moist.     Pharynx: Oropharynx is clear.  Eyes:     General: No scleral icterus.       Right eye: No discharge.        Left eye: No discharge.     Extraocular Movements: Extraocular movements intact.     Conjunctiva/sclera: Conjunctivae normal.     Pupils: Pupils are equal, round, and reactive to light.  Cardiovascular:     Rate and Rhythm: Normal rate and regular rhythm.     Pulses: Normal pulses.     Heart sounds: Normal heart sounds. No murmur heard.   No friction rub. No gallop.  Pulmonary:     Effort: Pulmonary effort is normal. No respiratory distress.     Breath sounds: Normal breath sounds. No stridor. No wheezing, rhonchi or rales.  Chest:     Chest wall: No tenderness.  Musculoskeletal:        General: Normal range of motion.     Cervical back: Normal range of motion and neck supple.  Skin:    General: Skin is warm and dry.     Capillary Refill: Capillary refill takes less than 2 seconds.     Coloration: Skin is not jaundiced or pale.     Findings: No bruising, erythema, lesion or rash.  Neurological:      General: No focal deficit present.     Mental Status: She is alert and oriented to person, place, and time. Mental status is at baseline.  Psychiatric:        Mood and Affect: Mood normal.        Behavior: Behavior normal.        Thought Content: Thought content normal.        Judgment: Judgment normal.    Results for orders placed or performed in visit on 02/02/21  Microscopic Examination   BLD  Result Value Ref Range   WBC, UA 0-5 0 - 5 /hpf   RBC 0-2 0 - 2 /hpf   Epithelial Cells (non renal) 0-10 0 - 10 /hpf   Bacteria, UA None seen None seen/Few  Bayer DCA Hb A1c Waived  Result Value Ref Range   HB A1C (BAYER DCA - WAIVED) 9.1 (H) <7.0 %  Urinalysis, Routine w reflex microscopic  Result Value Ref Range   Specific Gravity, UA >1.030 (H) 1.005 - 1.030   pH, UA 6.0 5.0 - 7.5   Color, UA Yellow Yellow   Appearance Ur Clear Clear   Leukocytes,UA Negative Negative   Protein,UA Trace (A) Negative/Trace   Glucose, UA 3+ (A) Negative   Ketones, UA Negative Negative   RBC, UA Trace (A) Negative   Bilirubin, UA Negative Negative   Urobilinogen, Ur 0.2 0.2 - 1.0 mg/dL   Nitrite, UA Negative Negative   Microscopic Examination See below:       Assessment & Plan:   Problem List Items Addressed This Visit       Endocrine   Diabetes mellitus type 2, uncomplicated ([facility]) - Primary    Not under good control with a1c of 9.1- will continue metformin and jardiance. Will start ozempic and check tolerance in 1 month. Call with any concerns.        Relevant Medications   metFORMIN (GLUCOPHAGE) 500 MG tablet   empagliflozin (JARDIANCE) 25 MG TABS tablet   Semaglutide,0.25 or 0.5MG /DOS, (OZEMPIC, 0.25 OR 0.5 MG/DOSE,) 2 MG/1.5ML SOPN   Other Relevant Orders   Bayer DCA Hb A1c Waived (Completed)   Lipid Panel w/o Chol/HDL Ratio   Comprehensive metabolic panel   CBC with Differential/Platelet   Other Visit Diagnoses     Dysuria       No sign of infection. Will send for  culture. Likely due to uncontrolled sugars. Continue to monitor.    Relevant Orders   Urinalysis, Routine w reflex microscopic (Completed)   Urine Culture        Follow up plan: Return in about 4 weeks (around 03/02/2021).

## 2021-02-02 NOTE — Assessment & Plan Note (Signed)
Not under good control with a1c of 9.1- will continue metformin and jardiance. Will start ozempic and check tolerance in 1 month. Call with any concerns.

## 2021-02-03 LAB — LIPID PANEL W/O CHOL/HDL RATIO
Cholesterol, Total: 205 mg/dL — ABNORMAL HIGH (ref 100–199)
HDL: 36 mg/dL — ABNORMAL LOW (ref >39)
LDL Chol Calc (NIH): 117 mg/dL — ABNORMAL HIGH (ref 0–99)
Triglycerides: 296 mg/dL — ABNORMAL HIGH (ref 0–149)
VLDL Cholesterol Cal: 52 mg/dL — ABNORMAL HIGH (ref 5–40)

## 2021-02-03 LAB — CBC WITH DIFFERENTIAL/PLATELET
Basophils Absolute: 0.1 10*3/uL (ref 0.0–0.2)
Basos: 1 %
EOS (ABSOLUTE): 0.3 10*3/uL (ref 0.0–0.4)
Eos: 3 %
Hematocrit: 44.6 % (ref 34.0–46.6)
Hemoglobin: 14.8 g/dL (ref 11.1–15.9)
Immature Grans (Abs): 0 10*3/uL (ref 0.0–0.1)
Immature Granulocytes: 0 %
Lymphocytes Absolute: 3.8 10*3/uL — ABNORMAL HIGH (ref 0.7–3.1)
Lymphs: 37 %
MCH: 29.4 pg (ref 26.6–33.0)
MCHC: 33.2 g/dL (ref 31.5–35.7)
MCV: 89 fL (ref 79–97)
Monocytes Absolute: 0.8 10*3/uL (ref 0.1–0.9)
Monocytes: 8 %
Neutrophils Absolute: 5.3 10*3/uL (ref 1.4–7.0)
Neutrophils: 51 %
Platelets: 382 10*3/uL (ref 150–450)
RBC: 5.04 x10E6/uL (ref 3.77–5.28)
RDW: 13 % (ref 11.7–15.4)
WBC: 10.4 10*3/uL (ref 3.4–10.8)

## 2021-02-03 LAB — COMPREHENSIVE METABOLIC PANEL
ALT: 30 IU/L (ref 0–32)
AST: 18 IU/L (ref 0–40)
Albumin/Globulin Ratio: 1.5 (ref 1.2–2.2)
Albumin: 4.6 g/dL (ref 3.8–4.8)
Alkaline Phosphatase: 65 IU/L (ref 44–121)
BUN/Creatinine Ratio: 19 (ref 9–23)
BUN: 18 mg/dL (ref 6–20)
Bilirubin Total: 0.3 mg/dL (ref 0.0–1.2)
CO2: 23 mmol/L (ref 20–29)
Calcium: 9.6 mg/dL (ref 8.7–10.2)
Chloride: 98 mmol/L (ref 96–106)
Creatinine, Ser: 0.97 mg/dL (ref 0.57–1.00)
Globulin, Total: 3 g/dL (ref 1.5–4.5)
Glucose: 262 mg/dL — ABNORMAL HIGH (ref 65–99)
Potassium: 4.4 mmol/L (ref 3.5–5.2)
Sodium: 137 mmol/L (ref 134–144)
Total Protein: 7.6 g/dL (ref 6.0–8.5)
eGFR: 76 mL/min/{1.73_m2} (ref >59)

## 2021-02-05 LAB — URINE CULTURE

## 2021-02-13 ENCOUNTER — Other Ambulatory Visit: Payer: Self-pay | Admitting: Family Medicine

## 2021-03-02 ENCOUNTER — Other Ambulatory Visit: Payer: Self-pay

## 2021-03-02 ENCOUNTER — Ambulatory Visit: Payer: BC Managed Care – PPO | Admitting: Family Medicine

## 2021-03-02 ENCOUNTER — Encounter: Payer: Self-pay | Admitting: Family Medicine

## 2021-03-02 VITALS — BP 126/82 | HR 80 | Temp 97.9°F | Wt 256.0 lb

## 2021-03-02 DIAGNOSIS — E119 Type 2 diabetes mellitus without complications: Secondary | ICD-10-CM | POA: Diagnosis not present

## 2021-03-02 DIAGNOSIS — G43109 Migraine with aura, not intractable, without status migrainosus: Secondary | ICD-10-CM | POA: Diagnosis not present

## 2021-03-02 NOTE — Progress Notes (Signed)
BP 126/82   Pulse 80   Temp 97.9 F (36.6 C)   Wt 256 lb (116.1 kg)   SpO2 95%   BMI 46.82 kg/m    Subjective:    Patient ID: Mary Drake, female    DOB: [birth year], 40 y.o.   MRN: [id number]  HPI: Mary Drake is a 40 y.o. female  Chief Complaint  Patient presents with   Diabetes    Follow up    Migraine    Patient requesting FMLA forms to be filled out, will bring by later today   DIABETES Hypoglycemic episodes:no Polydipsia/polyuria: no Visual disturbance: no Chest pain: no Paresthesias: no Glucose Monitoring: no  Accucheck frequency: Not Checking Taking Insulin?: no Blood Pressure Monitoring: not checking Retinal Examination: Not up to Date Foot Exam: Up to Date Diabetic Education: Completed Pneumovax: Up to Date Influenza: Up to Date Aspirin: no  Migraines have gotten better, but still having 1-3 migraines a month, has been decreasing and going down to about 1x a month. Has been feeling well with the 1 nortriptyline a month.   Relevant past medical, surgical, family and social history reviewed and updated as indicated. Interim medical history since our last visit reviewed. Allergies and medications reviewed and updated.  Review of Systems  Constitutional: Negative.   Respiratory: Negative.    Cardiovascular: Negative.   Gastrointestinal: Negative.   Musculoskeletal: Negative.   Psychiatric/Behavioral: Negative.     Per HPI unless specifically indicated above     Objective:    BP 126/82   Pulse 80   Temp 97.9 F (36.6 C)   Wt 256 lb (116.1 kg)   SpO2 95%   BMI 46.82 kg/m   Wt Readings from Last 3 Encounters:  03/02/21 256 lb (116.1 kg)  02/02/21 258 lb (117 kg)  09/26/20 254 lb (115.2 kg)    Physical Exam Vitals and nursing note reviewed.  Constitutional:      General: She is not in acute distress.    Appearance: Normal appearance. She is not ill-appearing, toxic-appearing or diaphoretic.  HENT:     Head:  Normocephalic and atraumatic.     Right Ear: External ear normal.     Left Ear: External ear normal.     Nose: Nose normal.     Mouth/Throat:     Mouth: Mucous membranes are moist.     Pharynx: Oropharynx is clear.  Eyes:     General: No scleral icterus.       Right eye: No discharge.        Left eye: No discharge.     Extraocular Movements: Extraocular movements intact.     Conjunctiva/sclera: Conjunctivae normal.     Pupils: Pupils are equal, round, and reactive to light.  Cardiovascular:     Rate and Rhythm: Normal rate and regular rhythm.     Pulses: Normal pulses.     Heart sounds: Normal heart sounds. No murmur heard.   No friction rub. No gallop.  Pulmonary:     Effort: Pulmonary effort is normal. No respiratory distress.     Breath sounds: Normal breath sounds. No stridor. No wheezing, rhonchi or rales.  Chest:     Chest wall: No tenderness.  Musculoskeletal:        General: Normal range of motion.     Cervical back: Normal range of motion and neck supple.  Skin:    General: Skin is warm and dry.     Capillary Refill: Capillary refill takes less  than 2 seconds.     Coloration: Skin is not jaundiced or pale.     Findings: No bruising, erythema, lesion or rash.  Neurological:     General: No focal deficit present.     Mental Status: She is alert and oriented to person, place, and time. Mental status is at baseline.  Psychiatric:        Mood and Affect: Mood normal.        Behavior: Behavior normal.        Thought Content: Thought content normal.        Judgment: Judgment normal.    Results for orders placed or performed in visit on 02/02/21  Urine Culture   Specimen: Blood   UR  Result Value Ref Range   Urine Culture, Routine Final report    Organism ID, Bacteria Comment   Microscopic Examination   BLD  Result Value Ref Range   WBC, UA 0-5 0 - 5 /hpf   RBC 0-2 0 - 2 /hpf   Epithelial Cells (non renal) 0-10 0 - 10 /hpf   Bacteria, UA None seen None seen/Few   Bayer DCA Hb A1c Waived  Result Value Ref Range   HB A1C (BAYER DCA - WAIVED) 9.1 (H) <7.0 %  Lipid Panel w/o Chol/HDL Ratio  Result Value Ref Range   Cholesterol, Total 205 (H) 100 - 199 mg/dL   Triglycerides 296 (H) 0 - 149 mg/dL   HDL 36 (L) >39 mg/dL   VLDL Cholesterol Cal 52 (H) 5 - 40 mg/dL   LDL Chol Calc (NIH) 117 (H) 0 - 99 mg/dL  Comprehensive metabolic panel  Result Value Ref Range   Glucose 262 (H) 65 - 99 mg/dL   BUN 18 6 - 20 mg/dL   Creatinine, Ser 0.97 0.57 - 1.00 mg/dL   eGFR 76 >59 mL/min/1.73   BUN/Creatinine Ratio 19 9 - 23   Sodium 137 134 - 144 mmol/L   Potassium 4.4 3.5 - 5.2 mmol/L   Chloride 98 96 - 106 mmol/L   CO2 23 20 - 29 mmol/L   Calcium 9.6 8.7 - 10.2 mg/dL   Total Protein 7.6 6.0 - 8.5 g/dL   Albumin 4.6 3.8 - 4.8 g/dL   Globulin, Total 3.0 1.5 - 4.5 g/dL   Albumin/Globulin Ratio 1.5 1.2 - 2.2   Bilirubin Total 0.3 0.0 - 1.2 mg/dL   Alkaline Phosphatase 65 44 - 121 IU/L   AST 18 0 - 40 IU/L   ALT 30 0 - 32 IU/L  CBC with Differential/Platelet  Result Value Ref Range   WBC 10.4 3.4 - 10.8 x10E3/uL   RBC 5.04 3.77 - 5.28 x10E6/uL   Hemoglobin 14.8 11.1 - 15.9 g/dL   Hematocrit 44.6 34.0 - 46.6 %   MCV 89 79 - 97 fL   MCH 29.4 26.6 - 33.0 pg   MCHC 33.2 31.5 - 35.7 g/dL   RDW 13.0 11.7 - 15.4 %   Platelets 382 150 - 450 x10E3/uL   Neutrophils 51 Not Estab. %   Lymphs 37 Not Estab. %   Monocytes 8 Not Estab. %   Eos 3 Not Estab. %   Basos 1 Not Estab. %   Neutrophils Absolute 5.3 1.4 - 7.0 x10E3/uL   Lymphocytes Absolute 3.8 (H) 0.7 - 3.1 x10E3/uL   Monocytes Absolute 0.8 0.1 - 0.9 x10E3/uL   EOS (ABSOLUTE) 0.3 0.0 - 0.4 x10E3/uL   Basophils Absolute 0.1 0.0 - 0.2 x10E3/uL   Immature Granulocytes  0 Not Estab. %   Immature Grans (Abs) 0.0 0.0 - 0.1 x10E3/uL  Urinalysis, Routine w reflex microscopic  Result Value Ref Range   Specific Gravity, UA >1.030 (H) 1.005 - 1.030   pH, UA 6.0 5.0 - 7.5   Color, UA Yellow Yellow   Appearance  Ur Clear Clear   Leukocytes,UA Negative Negative   Protein,UA Trace (A) Negative/Trace   Glucose, UA 3+ (A) Negative   Ketones, UA Negative Negative   RBC, UA Trace (A) Negative   Bilirubin, UA Negative Negative   Urobilinogen, Ur 0.2 0.2 - 1.0 mg/dL   Nitrite, UA Negative Negative   Microscopic Examination See below:       Assessment & Plan:   Problem List Items Addressed This Visit       Cardiovascular and Mediastinum   Migraines    Stable. Due for FMLA paperwork. Will fill them out for her. Call with any concerns.          Endocrine   Diabetes mellitus type 2, uncomplicated ([place]) - Primary    Tolerating her ozempic well. Continue to titrate up. Call with any concerns. Recheck 2 months. BMP checked today. Call with any concerns.        Relevant Orders   Basic metabolic panel     Follow up plan: Return in about 2 months (around 05/03/2021).

## 2021-03-02 NOTE — Assessment & Plan Note (Signed)
Stable. Due for FMLA paperwork. Will fill them out for her. Call with any concerns.

## 2021-03-02 NOTE — Assessment & Plan Note (Signed)
Tolerating her ozempic well. Continue to titrate up. Call with any concerns. Recheck 2 months. BMP checked today. Call with any concerns.

## 2021-03-02 NOTE — Patient Instructions (Signed)
Ozempic Titration: 0.5mg  weekly for 4 weeks 1mg  weekly for 4 weeks

## 2021-03-03 LAB — BASIC METABOLIC PANEL
BUN/Creatinine Ratio: 15 (ref 9–23)
BUN: 11 mg/dL (ref 6–24)
CO2: 22 mmol/L (ref 20–29)
Calcium: 9.2 mg/dL (ref 8.7–10.2)
Chloride: 99 mmol/L (ref 96–106)
Creatinine, Ser: 0.72 mg/dL (ref 0.57–1.00)
Glucose: 151 mg/dL — ABNORMAL HIGH (ref 65–99)
Potassium: 4.4 mmol/L (ref 3.5–5.2)
Sodium: 136 mmol/L (ref 134–144)
eGFR: 108 mL/min/{1.73_m2} (ref >59)

## 2021-03-07 ENCOUNTER — Telehealth: Payer: Self-pay | Admitting: *Deleted

## 2021-03-07 NOTE — Telephone Encounter (Signed)
Pt brought in FMLA form for Dr. Laural Benes to complete. The form is needed by next Friday 03/17/2021. Please call when ready.Placed in BIN for review

## 2021-03-07 NOTE — Telephone Encounter (Signed)
Placed form in provider signature folder. 

## 2021-03-14 ENCOUNTER — Telehealth: Payer: Self-pay

## 2021-03-14 NOTE — Telephone Encounter (Signed)
Advised patient FMLA form has been signed and faxed

## 2021-05-03 ENCOUNTER — Ambulatory Visit: Payer: BC Managed Care – PPO | Admitting: Family Medicine

## 2021-06-01 ENCOUNTER — Ambulatory Visit: Payer: BC Managed Care – PPO | Admitting: Family Medicine

## 2021-06-29 ENCOUNTER — Ambulatory Visit
Admission: RE | Admit: 2021-06-29 | Discharge: 2021-06-29 | Disposition: A | Discharge status: Home / Self Care | Payer: BC Managed Care – PPO | Source: Ambulatory Visit | Attending: Family Medicine | Admitting: Family Medicine

## 2021-06-29 ENCOUNTER — Ambulatory Visit
Admission: RE | Admit: 2021-06-29 | Discharge: 2021-06-29 | Disposition: A | Discharge status: Home / Self Care | Payer: BC Managed Care – PPO | Attending: Family Medicine | Admitting: Family Medicine

## 2021-06-29 ENCOUNTER — Other Ambulatory Visit: Payer: Self-pay

## 2021-06-29 ENCOUNTER — Encounter: Payer: Self-pay | Admitting: Family Medicine

## 2021-06-29 ENCOUNTER — Ambulatory Visit: Payer: BC Managed Care – PPO | Admitting: Family Medicine

## 2021-06-29 VITALS — BP 124/71 | HR 68 | Temp 98.1°F | Wt 264.2 lb

## 2021-06-29 DIAGNOSIS — G8929 Other chronic pain: Secondary | ICD-10-CM

## 2021-06-29 DIAGNOSIS — M25561 Pain in right knee: Secondary | ICD-10-CM

## 2021-06-29 DIAGNOSIS — E119 Type 2 diabetes mellitus without complications: Secondary | ICD-10-CM

## 2021-06-29 LAB — MICROALBUMIN, URINE WAIVED
Creatinine, Urine Waived: 200 mg/dL (ref 10–300)
Microalb, Ur Waived: 30 mg/L — ABNORMAL HIGH (ref 0–19)
Microalb/Creat Ratio: <30 mg/g (ref <30)

## 2021-06-29 LAB — BAYER DCA HB A1C WAIVED: HB A1C (BAYER DCA - WAIVED): 9 % — ABNORMAL HIGH (ref 4.8–5.6)

## 2021-06-29 MED ORDER — NORTRIPTYLINE HCL 25 MG PO CAPS: 25.0000 mg | ORAL_CAPSULE | Freq: Every day | ORAL | 1 refills | Status: DC

## 2021-06-29 MED ORDER — OZEMPIC (0.25 OR 0.5 MG/DOSE) 2 MG/1.5ML ~~LOC~~ SOPN: PEN_INJECTOR | SUBCUTANEOUS | 1 refills | Status: DC

## 2021-06-29 MED ORDER — RYBELSUS 7 MG PO TABS: 7.0000 mg | ORAL_TABLET | Freq: Every day | ORAL | 1 refills | Status: DC

## 2021-06-29 MED ORDER — RYBELSUS 3 MG PO TABS: 3.0000 mg | ORAL_TABLET | Freq: Every day | ORAL | 0 refills | Status: DC

## 2021-06-29 MED ORDER — EMPAGLIFLOZIN 25 MG PO TABS: 25.0000 mg | ORAL_TABLET | Freq: Every day | ORAL | 1 refills | Status: DC

## 2021-06-29 NOTE — Assessment & Plan Note (Signed)
Not doing great with a1c of 9.0- will start her on rybelsus and recheck 6 weeks. Call with any concerns. Continue to monitor.

## 2021-06-29 NOTE — Progress Notes (Signed)
BP 124/71   Pulse 68   Temp 98.1 F (36.7 C)   Wt 264 lb 3.2 oz (119.8 kg)   SpO2 96%   BMI 48.32 kg/m    Subjective:    Patient ID: Mary Drake, female    DOB: [birth year], 40 y.o.   MRN: [id number]  HPI: Mary Drake is a 40 y.o. female  Chief Complaint  Patient presents with   Diabetes   DIABETES Hypoglycemic episodes:no Polydipsia/polyuria: no Visual disturbance: no Chest pain: no Paresthesias: no Glucose Monitoring: no  Accucheck frequency: Not Checking  Fasting glucose: Taking Insulin?: no Blood Pressure Monitoring: not checking Retinal Examination: Not up to Date Foot Exam: Up to Date Diabetic Education: Completed Pneumovax: Up to Date Influenza: Given at work Aspirin: no  Has been having R knee pain that is radiating into her shin. It feels deep and aching. Worse with being on it. Better with rest. It's been going on for a few months, but has been getting worse. She is otherwise feeling OK.  Relevant past medical, surgical, family and social history reviewed and updated as indicated. Interim medical history since our last visit reviewed. Allergies and medications reviewed and updated.  Review of Systems  Constitutional: Negative.   Respiratory: Negative.    Cardiovascular: Negative.   Gastrointestinal: Negative.   Musculoskeletal:  Positive for arthralgias, gait problem and myalgias. Negative for back pain, joint swelling, neck pain and neck stiffness.  Skin: Negative.   Psychiatric/Behavioral: Negative.     Per HPI unless specifically indicated above     Objective:    BP 124/71   Pulse 68   Temp 98.1 F (36.7 C)   Wt 264 lb 3.2 oz (119.8 kg)   SpO2 96%   BMI 48.32 kg/m   Wt Readings from Last 3 Encounters:  06/29/21 264 lb 3.2 oz (119.8 kg)  03/02/21 256 lb (116.1 kg)  02/02/21 258 lb (117 kg)    Physical Exam Vitals and nursing note reviewed.  Constitutional:      General: She is not in acute distress.     Appearance: Normal appearance. She is not ill-appearing, toxic-appearing or diaphoretic.  HENT:     Head: Normocephalic and atraumatic.     Right Ear: External ear normal.     Left Ear: External ear normal.     Nose: Nose normal.     Mouth/Throat:     Mouth: Mucous membranes are moist.     Pharynx: Oropharynx is clear.  Eyes:     General: No scleral icterus.       Right eye: No discharge.        Left eye: No discharge.     Extraocular Movements: Extraocular movements intact.     Conjunctiva/sclera: Conjunctivae normal.     Pupils: Pupils are equal, round, and reactive to light.  Cardiovascular:     Rate and Rhythm: Normal rate and regular rhythm.     Pulses: Normal pulses.     Heart sounds: Normal heart sounds. No murmur heard.   No friction rub. No gallop.  Pulmonary:     Effort: Pulmonary effort is normal. No respiratory distress.     Breath sounds: Normal breath sounds. No stridor. No wheezing, rhonchi or rales.  Chest:     Chest wall: No tenderness.  Musculoskeletal:        General: Normal range of motion.     Cervical back: Normal range of motion and neck supple.  Skin:    General:  Skin is warm and dry.     Capillary Refill: Capillary refill takes less than 2 seconds.     Coloration: Skin is not jaundiced or pale.     Findings: No bruising, erythema, lesion or rash.  Neurological:     General: No focal deficit present.     Mental Status: She is alert and oriented to person, place, and time. Mental status is at baseline.  Psychiatric:        Mood and Affect: Mood normal.        Behavior: Behavior normal.        Thought Content: Thought content normal.        Judgment: Judgment normal.    Results for orders placed or performed in visit on 09/81/19  Basic metabolic panel  Result Value Ref Range   Glucose 151 (H) 65 - 99 mg/dL   BUN 11 6 - 24 mg/dL   Creatinine, Ser 0.72 0.57 - 1.00 mg/dL   eGFR 108 >59 mL/min/1.73   BUN/Creatinine Ratio 15 9 - 23   Sodium 136 134  - 144 mmol/L   Potassium 4.4 3.5 - 5.2 mmol/L   Chloride 99 96 - 106 mmol/L   CO2 22 20 - 29 mmol/L   Calcium 9.2 8.7 - 10.2 mg/dL      Assessment & Plan:   Problem List Items Addressed This Visit       Endocrine   Diabetes mellitus type 2, uncomplicated ([place]) - Primary    Not doing great with a1c of 9.0- will start her on rybelsus and recheck 6 weeks. Call with any concerns. Continue to monitor.       Relevant Medications   empagliflozin (JARDIANCE) 25 MG TABS tablet   Semaglutide (RYBELSUS) 3 MG TABS   Semaglutide (RYBELSUS) 7 MG TABS   Other Relevant Orders   CBC with Differential/Platelet   Comprehensive metabolic panel   Lipid Panel w/o Chol/HDL Ratio   Bayer DCA Hb A1c Waived   Microalbumin, Urine Waived   Other Visit Diagnoses     Chronic pain of right knee       Will check x-rays and start stretches. Await results. Treat as needed.    Relevant Orders   DG Knee Complete 4 Views Right   DG Tibia/Fibula Right        Follow up plan: Return in about 6 weeks (around 08/10/2021) for ok for virtual if she likes.

## 2021-06-30 LAB — CBC WITH DIFFERENTIAL/PLATELET
Basophils Absolute: 0.1 10*3/uL (ref 0.0–0.2)
Basos: 1 %
EOS (ABSOLUTE): 0.3 10*3/uL (ref 0.0–0.4)
Eos: 3 %
Hematocrit: 43.5 % (ref 34.0–46.6)
Hemoglobin: 14.3 g/dL (ref 11.1–15.9)
Immature Grans (Abs): 0 10*3/uL (ref 0.0–0.1)
Immature Granulocytes: 0 %
Lymphocytes Absolute: 2.7 10*3/uL (ref 0.7–3.1)
Lymphs: 33 %
MCH: 30 pg (ref 26.6–33.0)
MCHC: 32.9 g/dL (ref 31.5–35.7)
MCV: 91 fL (ref 79–97)
Monocytes Absolute: 0.6 10*3/uL (ref 0.1–0.9)
Monocytes: 7 %
Neutrophils Absolute: 4.4 10*3/uL (ref 1.4–7.0)
Neutrophils: 56 %
Platelets: 332 10*3/uL (ref 150–450)
RBC: 4.77 x10E6/uL (ref 3.77–5.28)
RDW: 12.7 % (ref 11.7–15.4)
WBC: 8.1 10*3/uL (ref 3.4–10.8)

## 2021-06-30 LAB — COMPREHENSIVE METABOLIC PANEL
ALT: 26 IU/L (ref 0–32)
AST: 17 IU/L (ref 0–40)
Albumin/Globulin Ratio: 1.4 (ref 1.2–2.2)
Albumin: 4.3 g/dL (ref 3.8–4.8)
Alkaline Phosphatase: 57 IU/L (ref 44–121)
BUN/Creatinine Ratio: 17 (ref 9–23)
BUN: 10 mg/dL (ref 6–24)
Bilirubin Total: 0.3 mg/dL (ref 0.0–1.2)
CO2: 24 mmol/L (ref 20–29)
Calcium: 9.3 mg/dL (ref 8.7–10.2)
Chloride: 100 mmol/L (ref 96–106)
Creatinine, Ser: 0.6 mg/dL (ref 0.57–1.00)
Globulin, Total: 3 g/dL (ref 1.5–4.5)
Glucose: 249 mg/dL — ABNORMAL HIGH (ref 70–99)
Potassium: 4.4 mmol/L (ref 3.5–5.2)
Sodium: 139 mmol/L (ref 134–144)
Total Protein: 7.3 g/dL (ref 6.0–8.5)
eGFR: 116 mL/min/{1.73_m2} (ref >59)

## 2021-06-30 LAB — LIPID PANEL W/O CHOL/HDL RATIO
Cholesterol, Total: 171 mg/dL (ref 100–199)
HDL: 36 mg/dL — ABNORMAL LOW (ref >39)
LDL Chol Calc (NIH): 117 mg/dL — ABNORMAL HIGH (ref 0–99)
Triglycerides: 96 mg/dL (ref 0–149)
VLDL Cholesterol Cal: 18 mg/dL (ref 5–40)

## 2021-08-10 ENCOUNTER — Telehealth: Payer: BC Managed Care – PPO | Admitting: Family Medicine

## 2021-08-14 ENCOUNTER — Encounter: Payer: Self-pay | Admitting: Family Medicine

## 2021-08-14 ENCOUNTER — Telehealth (INDEPENDENT_AMBULATORY_CARE_PROVIDER_SITE_OTHER): Payer: BC Managed Care – PPO | Admitting: Family Medicine

## 2021-08-14 DIAGNOSIS — E119 Type 2 diabetes mellitus without complications: Secondary | ICD-10-CM | POA: Diagnosis not present

## 2021-08-14 NOTE — Progress Notes (Signed)
There were no vitals taken for this visit.   Subjective:    Patient ID: Mary Drake, female    DOB: 25-Dec-1980, 40 y.o.   MRN: [id number]  HPI: Mary Drake is a 40 y.o. female  Chief Complaint  Patient presents with   Diabetes    No recent eye exam per patient   DIABETES Hypoglycemic episodes:no Polydipsia/polyuria: no Visual disturbance: no Chest pain: no Paresthesias: no Glucose Monitoring: no  Accucheck frequency: Not Checking Taking Insulin?: no Blood Pressure Monitoring: not checking Retinal Examination: Up to Date Foot Exam: Up to Date Diabetic Education: Completed Pneumovax: Up to Date Influenza: Up to Date Aspirin: no  Relevant past medical, surgical, family and social history reviewed and updated as indicated. Interim medical history since our last visit reviewed. Allergies and medications reviewed and updated.  Review of Systems  Constitutional: Negative.   Respiratory: Negative.    Cardiovascular: Negative.   Gastrointestinal: Negative.   Musculoskeletal: Negative.   Skin: Negative.   Psychiatric/Behavioral: Negative.     Per HPI unless specifically indicated above     Objective:    There were no vitals taken for this visit.  Wt Readings from Last 3 Encounters:  06/29/21 264 lb 3.2 oz (119.8 kg)  03/02/21 256 lb (116.1 kg)  02/02/21 258 lb (117 kg)    Physical Exam Vitals and nursing note reviewed.  Constitutional:      General: She is not in acute distress.    Appearance: Normal appearance. She is not ill-appearing, toxic-appearing or diaphoretic.  HENT:     Head: Normocephalic and atraumatic.     Right Ear: External ear normal.     Left Ear: External ear normal.     Nose: Nose normal.     Mouth/Throat:     Mouth: Mucous membranes are moist.     Pharynx: Oropharynx is clear.  Eyes:     General: No scleral icterus.       Right eye: No discharge.        Left eye: No discharge.     Conjunctiva/sclera: Conjunctivae  normal.     Pupils: Pupils are equal, round, and reactive to light.  Pulmonary:     Effort: Pulmonary effort is normal. No respiratory distress.     Comments: Speaking in full sentences Musculoskeletal:        General: Normal range of motion.     Cervical back: Normal range of motion.  Skin:    Coloration: Skin is not jaundiced or pale.     Findings: No bruising, erythema, lesion or rash.  Neurological:     Mental Status: She is alert and oriented to person, place, and time. Mental status is at baseline.  Psychiatric:        Mood and Affect: Mood normal.        Behavior: Behavior normal.        Thought Content: Thought content normal.        Judgment: Judgment normal.    Results for orders placed or performed in visit on 06/29/21  CBC with Differential/Platelet  Result Value Ref Range   WBC 8.1 3.4 - 10.8 x10E3/uL   RBC 4.77 3.77 - 5.28 x10E6/uL   Hemoglobin 14.3 11.1 - 15.9 g/dL   Hematocrit 43.5 34.0 - 46.6 %   MCV 91 79 - 97 fL   MCH 30.0 26.6 - 33.0 pg   MCHC 32.9 31.5 - 35.7 g/dL   RDW 12.7 11.7 - 15.4 %  Platelets 332 150 - 450 x10E3/uL   Neutrophils 56 Not Estab. %   Lymphs 33 Not Estab. %   Monocytes 7 Not Estab. %   Eos 3 Not Estab. %   Basos 1 Not Estab. %   Neutrophils Absolute 4.4 1.4 - 7.0 x10E3/uL   Lymphocytes Absolute 2.7 0.7 - 3.1 x10E3/uL   Monocytes Absolute 0.6 0.1 - 0.9 x10E3/uL   EOS (ABSOLUTE) 0.3 0.0 - 0.4 x10E3/uL   Basophils Absolute 0.1 0.0 - 0.2 x10E3/uL   Immature Granulocytes 0 Not Estab. %   Immature Grans (Abs) 0.0 0.0 - 0.1 x10E3/uL  Comprehensive metabolic panel  Result Value Ref Range   Glucose 249 (H) 70 - 99 mg/dL   BUN 10 6 - 24 mg/dL   Creatinine, Ser 0.60 0.57 - 1.00 mg/dL   eGFR 116 >59 mL/min/1.73   BUN/Creatinine Ratio 17 9 - 23   Sodium 139 134 - 144 mmol/L   Potassium 4.4 3.5 - 5.2 mmol/L   Chloride 100 96 - 106 mmol/L   CO2 24 20 - 29 mmol/L   Calcium 9.3 8.7 - 10.2 mg/dL   Total Protein 7.3 6.0 - 8.5 g/dL    Albumin 4.3 3.8 - 4.8 g/dL   Globulin, Total 3.0 1.5 - 4.5 g/dL   Albumin/Globulin Ratio 1.4 1.2 - 2.2   Bilirubin Total 0.3 0.0 - 1.2 mg/dL   Alkaline Phosphatase 57 44 - 121 IU/L   AST 17 0 - 40 IU/L   ALT 26 0 - 32 IU/L  Lipid Panel w/o Chol/HDL Ratio  Result Value Ref Range   Cholesterol, Total 171 100 - 199 mg/dL   Triglycerides 96 0 - 149 mg/dL   HDL 36 (L) >39 mg/dL   VLDL Cholesterol Cal 18 5 - 40 mg/dL   LDL Chol Calc (NIH) 117 (H) 0 - 99 mg/dL  Bayer DCA Hb A1c Waived  Result Value Ref Range   HB A1C (BAYER DCA - WAIVED) 9.0 (H) 4.8 - 5.6 %  Microalbumin, Urine Waived  Result Value Ref Range   Microalb, Ur Waived 30 (H) 0 - 19 mg/L   Creatinine, Urine Waived 200 10 - 300 mg/dL   Microalb/Creat Ratio <30 <30 mg/g      Assessment & Plan:   Problem List Items Addressed This Visit       Endocrine   Diabetes mellitus type 2, uncomplicated ([place])    Tolerating her 40m rybelsus well. Will increase to the 740min the couple of weeks, recheck sugars in 6 weeks.         Follow up plan: Return in about 6 weeks (around 09/25/2021).    This visit was completed via video visit through MyChart due to the restrictions of the COVID-19 pandemic. All issues as above were discussed and addressed. Physical exam was done as above through visual confirmation on video through MyChart. If it was felt that the patient should be evaluated in the office, they were directed there. The patient verbally consented to this visit. Location of the patient: home Location of the provider: work Those involved with this call:  Provider: MePark LiterDO CMA: CrLouanna RawCMSolvayesk/Registration: AlFirstEnergy CorpTime spent on call:  15 minutes with patient face to face via video conference. More than 50% of this time was spent in counseling and coordination of care. 23 minutes total spent in review of patient's record and preparation of their chart.

## 2021-08-14 NOTE — Assessment & Plan Note (Signed)
Tolerating her 3mg  rybelsus well. Will increase to the 7mg  in the couple of weeks, recheck sugars in 6 weeks.

## 2021-11-17 ENCOUNTER — Encounter: Payer: Self-pay | Admitting: Family Medicine

## 2021-11-17 ENCOUNTER — Telehealth (INDEPENDENT_AMBULATORY_CARE_PROVIDER_SITE_OTHER): Payer: BC Managed Care – PPO | Admitting: Family Medicine

## 2021-11-17 DIAGNOSIS — G43109 Migraine with aura, not intractable, without status migrainosus: Secondary | ICD-10-CM | POA: Diagnosis not present

## 2021-11-17 DIAGNOSIS — E119 Type 2 diabetes mellitus without complications: Secondary | ICD-10-CM | POA: Diagnosis not present

## 2021-11-17 MED ORDER — SUMATRIPTAN SUCCINATE 100 MG PO TABS: 100.0000 mg | ORAL_TABLET | ORAL | 6 refills | Status: DC | PRN

## 2021-11-17 MED ORDER — AZELASTINE HCL 0.1 % NA SOLN: 2.0000 | Freq: Two times a day (BID) | NASAL | 12 refills | Status: DC

## 2021-11-17 NOTE — Progress Notes (Signed)
Appt scheduled

## 2021-11-17 NOTE — Assessment & Plan Note (Signed)
Tolerating her medicine well. Will get her in for A1c and adjust medicine as needed.  ?

## 2021-11-17 NOTE — Progress Notes (Signed)
? ?There were no vitals taken for this visit.  ? ?Subjective:  ? ? Patient ID: Mary Drake, female    DOB: May 07, 1981, 41 y.o.   MRN: [id number] ? ?HPI: ?Mary Drake is a 41 y.o. female ? ?Chief Complaint  ?Patient presents with  ? Migraine  ?  Pt states she needs updated FMLA for her migraines, states she will bring her paperwork by the office  ? ?Migraines have continued to come on in spurts. She was not doing well from [place], then were OK, then started to get bad now with the allergy season. She has been out of her allergy nasal spray. She doesn't get the headaches as much as the nausea and brain fog. She continues with migraines about 2-3x a month for about a day. She is otherwise feeling OK. ? ?DIABETES ?Hypoglycemic episodes:no ?Polydipsia/polyuria: no ?Visual disturbance: no ?Chest pain: no ?Paresthesias: no ?Glucose Monitoring: no ? Accucheck frequency: Not Checking ?Taking Insulin?: no ?Blood Pressure Monitoring: not checking ?Retinal Examination: Up to Date ?Foot Exam: Up to Date ?Diabetic Education: Completed ?Pneumovax: Up to Date ?Influenza: Up to Date ?Aspirin: no ? ? ?Relevant past medical, surgical, family and social history reviewed and updated as indicated. Interim medical history since our last visit reviewed. ?Allergies and medications reviewed and updated. ? ?Review of Systems  ?Constitutional: Negative.   ?Respiratory: Negative.    ?Cardiovascular: Negative.   ?Musculoskeletal: Negative.   ?Neurological: Negative.   ?Psychiatric/Behavioral: Negative.    ? ?Per HPI unless specifically indicated above ? ?   ?Objective:  ?  ?There were no vitals taken for this visit.  ?Wt Readings from Last 3 Encounters:  ?06/29/21 264 lb 3.2 oz (119.8 kg)  ?03/02/21 256 lb (116.1 kg)  ?02/02/21 258 lb (117 kg)  ?  ?Physical Exam ?Vitals and nursing note reviewed.  ?Constitutional:   ?   General: She is not in acute distress. ?   Appearance: Normal appearance. She is not ill-appearing,  toxic-appearing or diaphoretic.  ?HENT:  ?   Head: Normocephalic and atraumatic.  ?   Right Ear: External ear normal.  ?   Left Ear: External ear normal.  ?   Nose: Nose normal.  ?   Mouth/Throat:  ?   Mouth: Mucous membranes are moist.  ?   Pharynx: Oropharynx is clear.  ?Eyes:  ?   General: No scleral icterus.    ?   Right eye: No discharge.     ?   Left eye: No discharge.  ?   Conjunctiva/sclera: Conjunctivae normal.  ?   Pupils: Pupils are equal, round, and reactive to light.  ?Pulmonary:  ?   Effort: Pulmonary effort is normal. No respiratory distress.  ?   Comments: Speaking in full sentences ?Musculoskeletal:     ?   General: Normal range of motion.  ?   Cervical back: Normal range of motion.  ?Skin: ?   Coloration: Skin is not jaundiced or pale.  ?   Findings: No bruising, erythema, lesion or rash.  ?Neurological:  ?   Mental Status: She is alert and oriented to person, place, and time. Mental status is at baseline.  ?Psychiatric:     ?   Mood and Affect: Mood normal.     ?   Behavior: Behavior normal.     ?   Thought Content: Thought content normal.     ?   Judgment: Judgment normal.  ? ? ?Results for orders placed or performed in visit  on 06/29/21  ?CBC with Differential/Platelet  ?Result Value Ref Range  ? WBC 8.1 3.4 - 10.8 x10E3/uL  ? RBC 4.77 3.77 - 5.28 x10E6/uL  ? Hemoglobin 14.3 11.1 - 15.9 g/dL  ? Hematocrit 43.5 34.0 - 46.6 %  ? MCV 91 79 - 97 fL  ? MCH 30.0 26.6 - 33.0 pg  ? MCHC 32.9 31.5 - 35.7 g/dL  ? RDW 12.7 11.7 - 15.4 %  ? Platelets 332 150 - 450 x10E3/uL  ? Neutrophils 56 Not Estab. %  ? Lymphs 33 Not Estab. %  ? Monocytes 7 Not Estab. %  ? Eos 3 Not Estab. %  ? Basos 1 Not Estab. %  ? Neutrophils Absolute 4.4 1.4 - 7.0 x10E3/uL  ? Lymphocytes Absolute 2.7 0.7 - 3.1 x10E3/uL  ? Monocytes Absolute 0.6 0.1 - 0.9 x10E3/uL  ? EOS (ABSOLUTE) 0.3 0.0 - 0.4 x10E3/uL  ? Basophils Absolute 0.1 0.0 - 0.2 x10E3/uL  ? Immature Granulocytes 0 Not Estab. %  ? Immature Grans (Abs) 0.0 0.0 - 0.1 x10E3/uL   ?Comprehensive metabolic panel  ?Result Value Ref Range  ? Glucose 249 (H) 70 - 99 mg/dL  ? BUN 10 6 - 24 mg/dL  ? Creatinine, Ser 0.60 0.57 - 1.00 mg/dL  ? eGFR 116 >59 mL/min/1.73  ? BUN/Creatinine Ratio 17 9 - 23  ? Sodium 139 134 - 144 mmol/L  ? Potassium 4.4 3.5 - 5.2 mmol/L  ? Chloride 100 96 - 106 mmol/L  ? CO2 24 20 - 29 mmol/L  ? Calcium 9.3 8.7 - 10.2 mg/dL  ? Total Protein 7.3 6.0 - 8.5 g/dL  ? Albumin 4.3 3.8 - 4.8 g/dL  ? Globulin, Total 3.0 1.5 - 4.5 g/dL  ? Albumin/Globulin Ratio 1.4 1.2 - 2.2  ? Bilirubin Total 0.3 0.0 - 1.2 mg/dL  ? Alkaline Phosphatase 57 44 - 121 IU/L  ? AST 17 0 - 40 IU/L  ? ALT 26 0 - 32 IU/L  ?Lipid Panel w/o Chol/HDL Ratio  ?Result Value Ref Range  ? Cholesterol, Total 171 100 - 199 mg/dL  ? Triglycerides 96 0 - 149 mg/dL  ? HDL 36 (L) >39 mg/dL  ? VLDL Cholesterol Cal 18 5 - 40 mg/dL  ? LDL Chol Calc (NIH) 117 (H) 0 - 99 mg/dL  ?Bayer DCA Hb A1c Waived  ?Result Value Ref Range  ? HB A1C (BAYER DCA - WAIVED) 9.0 (H) 4.8 - 5.6 %  ?Microalbumin, Urine Waived  ?Result Value Ref Range  ? Microalb, Ur Waived 30 (H) 0 - 19 mg/L  ? Creatinine, Urine Waived 200 10 - 300 mg/dL  ? Microalb/Creat Ratio <30 <30 mg/g  ? ?   ?Assessment & Plan:  ? ?Problem List Items Addressed This Visit   ? ?  ? Cardiovascular and Mediastinum  ? Migraines  ?  Stable. Will add imitrex to help with migraines. Will continue nortriptyline. Continue FMLA 3-4x a month for 1-2 days due to migraines. Call with any concerns. Continue to monitor.  ?  ?  ? Relevant Medications  ? SUMAtriptan (IMITREX) 100 MG tablet  ?  ? Endocrine  ? Diabetes mellitus type 2, uncomplicated ([place]) - Primary  ?  Tolerating her medicine well. Will get her in for A1c and adjust medicine as needed.  ?  ?  ? Relevant Orders  ? Bayer DCA Hb A1c Waived  ?  ? ?Follow up plan: ?Return in about 3 months (around 02/17/2022). ? ? ? ? ? ?

## 2021-11-17 NOTE — Assessment & Plan Note (Signed)
Stable. Will add imitrex to help with migraines. Will continue nortriptyline. Continue FMLA 3-4x a month for 1-2 days due to migraines. Call with any concerns. Continue to monitor.  ?

## 2021-11-23 ENCOUNTER — Telehealth: Payer: Self-pay | Admitting: Family Medicine

## 2021-11-23 NOTE — Telephone Encounter (Signed)
Patient brought in paperwork  (health care certification) that needs to be completed by the provider. Paperwork was placed in the providers folder for completion and faxed back. ?

## 2021-11-27 LAB — HM DIABETES EYE EXAM

## 2022-01-18 ENCOUNTER — Encounter: Payer: Self-pay | Admitting: Internal Medicine

## 2022-01-18 ENCOUNTER — Telehealth (INDEPENDENT_AMBULATORY_CARE_PROVIDER_SITE_OTHER): Payer: BC Managed Care – PPO | Admitting: Internal Medicine

## 2022-01-18 DIAGNOSIS — J069 Acute upper respiratory infection, unspecified: Secondary | ICD-10-CM | POA: Diagnosis not present

## 2022-01-18 MED ORDER — BENZONATATE 100 MG PO CAPS: 100.0000 mg | ORAL_CAPSULE | Freq: Two times a day (BID) | ORAL | 0 refills | Status: DC | PRN

## 2022-01-18 MED ORDER — AMOXICILLIN 500 MG PO CAPS: 500.0000 mg | ORAL_CAPSULE | Freq: Two times a day (BID) | ORAL | 0 refills | Status: AC

## 2022-01-18 NOTE — Progress Notes (Signed)
There were no vitals taken for this visit.   Subjective:    Patient ID: Mary Drake, female    DOB: 1981-04-20, 41 y.o.   MRN: [id number]  Chief Complaint  Patient presents with   Sore Throat    Started on Tuesday.   Nasal Congestion   Swollen Glands    Patient states that her glands are swollen today    HPI: SAVANAH Drake is a 41 y.o. female   This visit was completed via video visit through MyChart due to the restrictions of the COVID-19 pandemic. All issues as above were discussed and addressed. Physical exam was done as above through visual confirmation on video through MyChart. If it was felt that the patient should be evaluated in the office, they were directed there. The patient verbally consented to this visit. Location of the patient: home Location of the provider: work Those involved with this call:  Provider: Loura Pardon, MD CMA: Tristan Schroeder, CMA Front Desk/Registration: [employer]  Time spent on call: 15 minutes with patient face to face via video conference. More than 50% of this time was spent in counseling and coordination of care. 10 minutes total spent in review of patient's record and preparation of their chart.    Sore Throat  This is a new (stuffy nose , chills + sore throat+) problem. The current episode started in the past 7 days (right side worse than the left, is tender per pt. checked the back of her throat isnt red, no white bumps per her. has been exposed to mom who was sick with URI). There has been no fever. Associated symptoms include congestion, coughing and swollen glands. Pertinent negatives include no abdominal pain, diarrhea, drooling, ear discharge, ear pain, headaches, hoarse voice, plugged ear sensation, neck pain, shortness of breath, stridor, trouble swallowing or vomiting.   Chief Complaint  Patient presents with   Sore Throat    Started on Tuesday.   Nasal Congestion   Swollen Glands    Patient states that her  glands are swollen today    Relevant past medical, surgical, family and social history reviewed and updated as indicated. Interim medical history since our last visit reviewed. Allergies and medications reviewed and updated.  Review of Systems  HENT:  Positive for congestion and sore throat. Negative for drooling, ear discharge, ear pain, hoarse voice, mouth sores, nosebleeds, postnasal drip, sinus pressure, sinus pain and trouble swallowing.   Eyes:  Negative for discharge.  Respiratory:  Positive for cough. Negative for apnea, chest tightness, shortness of breath, wheezing and stridor.   Gastrointestinal:  Negative for abdominal pain, diarrhea and vomiting.  Musculoskeletal:  Negative for neck pain.  Neurological:  Negative for headaches.   Per HPI unless specifically indicated above     Objective:    There were no vitals taken for this visit.  Wt Readings from Last 3 Encounters:  06/29/21 264 lb 3.2 oz (119.8 kg)  03/02/21 256 lb (116.1 kg)  02/02/21 258 lb (117 kg)    Physical Exam Vitals and nursing note reviewed.  Constitutional:      General: She is not in acute distress.    Appearance: Normal appearance. She is not ill-appearing, toxic-appearing or diaphoretic.  Pulmonary:     Breath sounds: No rhonchi.  Abdominal:     General: Abdomen is flat. Bowel sounds are normal.  Lymphadenopathy:     Cervical: No cervical adenopathy.  Skin:    Coloration: Skin is not jaundiced.  Neurological:  Mental Status: She is alert.    Results for orders placed or performed in visit on 01/02/22  HM DIABETES EYE EXAM  Result Value Ref Range   HM Diabetic Eye Exam No Retinopathy No Retinopathy        Current Outpatient Medications:    amoxicillin (AMOXIL) 500 MG capsule, Take 1 capsule (500 mg total) by mouth 2 (two) times daily for 7 days., Disp: 14 capsule, Rfl: 0   azelastine (ASTELIN) 0.1 % nasal spray, Place 2 sprays into both nostrils 2 (two) times daily. Use in each  nostril as directed, Disp: 30 mL, Rfl: 12   benzonatate (TESSALON) 100 MG capsule, Take 1 capsule (100 mg total) by mouth 2 (two) times daily as needed for cough., Disp: 20 capsule, Rfl: 0   cetirizine (ZYRTEC) 10 MG tablet, Take 10 mg by mouth daily., Disp: , Rfl:    nortriptyline (PAMELOR) 25 MG capsule, Take 1-4 capsules (25-100 mg total) by mouth at bedtime., Disp: 180 capsule, Rfl: 1   Semaglutide (RYBELSUS) 7 MG TABS, Take 7 mg by mouth daily., Disp: 90 tablet, Rfl: 1   SUMAtriptan (IMITREX) 100 MG tablet, Take 1 tablet (100 mg total) by mouth every 2 (two) hours as needed for migraine. May repeat in 2 hours if headache persists or recurs., Disp: 10 tablet, Rfl: 6   TALTZ 80 MG/ML SOAJ, , Disp: , Rfl:     Assessment & Plan:   URI with sore throat will start pt on amoxicillin for such  pt advised to take Tylenol q 4- 6 hourly as needed. pt to take allegra q pm as needed and to call office if symptoms worsened pt verbalised understanding of such.    Problem List Items Addressed This Visit       Respiratory   Viral upper respiratory tract infection - Primary     No orders of the defined types were placed in this encounter.    Meds ordered this encounter  Medications   amoxicillin (AMOXIL) 500 MG capsule    Sig: Take 1 capsule (500 mg total) by mouth 2 (two) times daily for 7 days.    Dispense:  14 capsule    Refill:  0   benzonatate (TESSALON) 100 MG capsule    Sig: Take 1 capsule (100 mg total) by mouth 2 (two) times daily as needed for cough.    Dispense:  20 capsule    Refill:  0     Follow up plan: No follow-ups on file.

## 2022-02-19 ENCOUNTER — Ambulatory Visit: Payer: BC Managed Care – PPO | Admitting: Family Medicine

## 2022-03-16 ENCOUNTER — Encounter: Payer: Self-pay | Admitting: Family Medicine

## 2022-03-16 ENCOUNTER — Ambulatory Visit: Payer: BC Managed Care – PPO | Admitting: Family Medicine

## 2022-03-16 VITALS — BP 121/83 | HR 58 | Temp 97.6°F | Wt 265.4 lb

## 2022-03-16 DIAGNOSIS — R300 Dysuria: Secondary | ICD-10-CM | POA: Diagnosis not present

## 2022-03-16 DIAGNOSIS — E119 Type 2 diabetes mellitus without complications: Secondary | ICD-10-CM | POA: Diagnosis not present

## 2022-03-16 DIAGNOSIS — B372 Candidiasis of skin and nail: Secondary | ICD-10-CM | POA: Diagnosis not present

## 2022-03-16 DIAGNOSIS — G43109 Migraine with aura, not intractable, without status migrainosus: Secondary | ICD-10-CM

## 2022-03-16 LAB — URINALYSIS, ROUTINE W REFLEX MICROSCOPIC
Bilirubin, UA: NEGATIVE
Ketones, UA: NEGATIVE
Leukocytes,UA: NEGATIVE
Nitrite, UA: NEGATIVE
Specific Gravity, UA: >1.03 — ABNORMAL HIGH (ref 1.005–1.030)
Urobilinogen, Ur: 0.2 mg/dL (ref 0.2–1.0)
pH, UA: 5.5 (ref 5.0–7.5)

## 2022-03-16 LAB — MICROSCOPIC EXAMINATION

## 2022-03-16 LAB — WET PREP FOR TRICH, YEAST, CLUE
Clue Cell Exam: NEGATIVE
Trichomonas Exam: NEGATIVE
Yeast Exam: POSITIVE — AB

## 2022-03-16 LAB — BAYER DCA HB A1C WAIVED: HB A1C (BAYER DCA - WAIVED): 9.4 % — ABNORMAL HIGH (ref 4.8–5.6)

## 2022-03-16 MED ORDER — NYSTATIN 100000 UNIT/GM EX POWD: 1.0000 | Freq: Three times a day (TID) | CUTANEOUS | 3 refills | Status: DC

## 2022-03-16 MED ORDER — NORTRIPTYLINE HCL 25 MG PO CAPS: 25.0000 mg | ORAL_CAPSULE | Freq: Every day | ORAL | 1 refills | Status: DC

## 2022-03-16 MED ORDER — NURTEC 75 MG PO TBDP: 75.0000 mg | ORAL_TABLET | Freq: Every day | ORAL | 12 refills | Status: DC | PRN

## 2022-03-16 MED ORDER — OZEMPIC (0.25 OR 0.5 MG/DOSE) 2 MG/1.5ML ~~LOC~~ SOPN: PEN_INJECTOR | SUBCUTANEOUS | 0 refills | Status: DC

## 2022-03-16 MED ORDER — FLUCONAZOLE 150 MG PO TABS: 150.0000 mg | ORAL_TABLET | Freq: Every day | ORAL | 0 refills | Status: DC

## 2022-03-16 NOTE — Patient Instructions (Signed)
[email or web address] 

## 2022-03-16 NOTE — Assessment & Plan Note (Signed)
Did not do well on sumatriptan. Will change to nurtec and continue nortriptyline. Call with any concerns.

## 2022-03-16 NOTE — Progress Notes (Signed)
BP 121/83   Pulse (!) 58   Temp 97.6 F (36.4 C)   Wt 265 lb 6.4 oz (120.4 kg)   SpO2 99%   BMI 48.54 kg/m    Subjective:    Patient ID: Mary Drake, female    DOB: 26-Aug-1981, 41 y.o.   MRN: [id number]  HPI: BRESHAE BELCHER is a 41 y.o. female  Chief Complaint  Patient presents with   Diabetes   Migraine   Skin Concern    Patient states her skin around her labia area is itchy, burning, and feels very dry. Patient has been using Zinc cream, it helps but has not gone away. Patient states symptoms have been going on for about 1 month. Patient states she notices discharge when she wipes.   DIABETES Hypoglycemic episodes:no Polydipsia/polyuria: yes Visual disturbance: no Chest pain: no Paresthesias: no Glucose Monitoring: no  Accucheck frequency: Not Checking Taking Insulin?: no Blood Pressure Monitoring: not checking Retinal Examination: Up to Date Foot Exam: Not up to Date Diabetic Education: Completed Pneumovax: Up to Date Influenza: Up to Date Aspirin: no  Migraines have been OK. Sumatriptan made them worse and she didn't like how she felt on them. No problems with the nortripytline.   RASH Duration:  about a month  Location: labia and skin folds  Itching: yes Burning: yes Redness: yes Oozing: yes Scaling: yes Blisters: no Painful: no Fevers: no Change in detergents/soaps/personal care products: no Recent illness: no Recent travel:no History of same: yes Context: stable Alleviating factors: nothing Treatments attempted:zinc cream Shortness of breath: no  Throat/tongue swelling: no Myalgias/arthralgias: no   Relevant past medical, surgical, family and social history reviewed and updated as indicated. Interim medical history since our last visit reviewed. Allergies and medications reviewed and updated.  Review of Systems  Constitutional: Negative.   Respiratory: Negative.    Cardiovascular: Negative.   Gastrointestinal: Negative.    Genitourinary:  Positive for vaginal discharge. Negative for decreased urine volume, difficulty urinating, dyspareunia, dysuria, enuresis, flank pain, frequency, genital sores, hematuria, menstrual problem, pelvic pain, urgency, vaginal bleeding and vaginal pain.  Musculoskeletal: Negative.   Skin:  Positive for color change and rash. Negative for pallor and wound.  Psychiatric/Behavioral: Negative.      Per HPI unless specifically indicated above     Objective:    BP 121/83   Pulse (!) 58   Temp 97.6 F (36.4 C)   Wt 265 lb 6.4 oz (120.4 kg)   SpO2 99%   BMI 48.54 kg/m   Wt Readings from Last 3 Encounters:  03/16/22 265 lb 6.4 oz (120.4 kg)  06/29/21 264 lb 3.2 oz (119.8 kg)  03/02/21 256 lb (116.1 kg)    Physical Exam Vitals and nursing note reviewed.  Constitutional:      General: She is not in acute distress.    Appearance: Normal appearance. She is obese. She is not ill-appearing, toxic-appearing or diaphoretic.  HENT:     Head: Normocephalic and atraumatic.     Right Ear: External ear normal.     Left Ear: External ear normal.     Nose: Nose normal.     Mouth/Throat:     Mouth: Mucous membranes are moist.     Pharynx: Oropharynx is clear.  Eyes:     General: No scleral icterus.       Right eye: No discharge.        Left eye: No discharge.     Extraocular Movements: Extraocular movements intact.  Conjunctiva/sclera: Conjunctivae normal.     Pupils: Pupils are equal, round, and reactive to light.  Cardiovascular:     Rate and Rhythm: Normal rate and regular rhythm.     Pulses: Normal pulses.     Heart sounds: Normal heart sounds. No murmur heard.    No friction rub. No gallop.  Pulmonary:     Effort: Pulmonary effort is normal. No respiratory distress.     Breath sounds: Normal breath sounds. No stridor. No wheezing, rhonchi or rales.  Chest:     Chest wall: No tenderness.  Musculoskeletal:        General: Normal range of motion.     Cervical back:  Normal range of motion and neck supple.  Skin:    General: Skin is warm and dry.     Capillary Refill: Capillary refill takes less than 2 seconds.     Coloration: Skin is not jaundiced or pale.     Findings: Erythema (erythematous angry well defined borders in pannus folds) present. No bruising, lesion or rash.  Neurological:     General: No focal deficit present.     Mental Status: She is alert and oriented to person, place, and time. Mental status is at baseline.  Psychiatric:        Mood and Affect: Mood normal.        Behavior: Behavior normal.        Thought Content: Thought content normal.        Judgment: Judgment normal.     Results for orders placed or performed in visit on 01/02/22  HM DIABETES EYE EXAM  Result Value Ref Range   HM Diabetic Eye Exam No Retinopathy No Retinopathy      Assessment & Plan:   Problem List Items Addressed This Visit       Cardiovascular and Mediastinum   Migraines    Did not do well on sumatriptan. Will change to nurtec and continue nortriptyline. Call with any concerns.       Relevant Medications   Rimegepant Sulfate (NURTEC) 75 MG TBDP   nortriptyline (PAMELOR) 25 MG capsule     Endocrine   Diabetes mellitus type 2, uncomplicated ([facility]) - Primary    Has been forgetting to take her medicine daily. Will change back to weekly injectable and recheck for tolerance in 1 month.       Relevant Medications   Semaglutide,0.25 or 0.5MG /DOS, (OZEMPIC, 0.25 OR 0.5 MG/DOSE,) 2 MG/1.5ML SOPN   Other Relevant Orders   Bayer DCA Hb A1c Waived   Comprehensive metabolic panel   CBC with Differential/Platelet   Lipid Panel w/o Chol/HDL Ratio   Other Visit Diagnoses     Candidal intertrigo       Will treat with diflucan and nystatin. Call with any concerns. Continue to monitor.    Relevant Medications   nystatin (MYCOSTATIN/NYSTOP) powder   fluconazole (DIFLUCAN) 150 MG tablet   Dysuria       + yeast and sugar   Relevant Orders    Urinalysis, Routine w reflex microscopic   WET PREP FOR TRICH, YEAST, CLUE        Follow up plan: Return in about 4 weeks (around 04/13/2022) for physical.

## 2022-03-16 NOTE — Assessment & Plan Note (Signed)
Has been forgetting to take her medicine daily. Will change back to weekly injectable and recheck for tolerance in 1 month.

## 2022-03-17 LAB — COMPREHENSIVE METABOLIC PANEL
ALT: 27 IU/L (ref 0–32)
AST: 14 IU/L (ref 0–40)
Albumin/Globulin Ratio: 1.4 (ref 1.2–2.2)
Albumin: 4.5 g/dL (ref 3.9–4.9)
Alkaline Phosphatase: 58 IU/L (ref 44–121)
BUN/Creatinine Ratio: 18 (ref 9–23)
BUN: 14 mg/dL (ref 6–24)
Bilirubin Total: 0.2 mg/dL (ref 0.0–1.2)
CO2: 24 mmol/L (ref 20–29)
Calcium: 9.6 mg/dL (ref 8.7–10.2)
Chloride: 99 mmol/L (ref 96–106)
Creatinine, Ser: 0.77 mg/dL (ref 0.57–1.00)
Globulin, Total: 3.2 g/dL (ref 1.5–4.5)
Glucose: 220 mg/dL — ABNORMAL HIGH (ref 70–99)
Potassium: 4.3 mmol/L (ref 3.5–5.2)
Sodium: 137 mmol/L (ref 134–144)
Total Protein: 7.7 g/dL (ref 6.0–8.5)
eGFR: 99 mL/min/{1.73_m2} (ref >59)

## 2022-03-17 LAB — CBC WITH DIFFERENTIAL/PLATELET
Basophils Absolute: 0.1 10*3/uL (ref 0.0–0.2)
Basos: 2 %
EOS (ABSOLUTE): 0.3 10*3/uL (ref 0.0–0.4)
Eos: 4 %
Hematocrit: 43.9 % (ref 34.0–46.6)
Hemoglobin: 14.5 g/dL (ref 11.1–15.9)
Immature Grans (Abs): 0 10*3/uL (ref 0.0–0.1)
Immature Granulocytes: 0 %
Lymphocytes Absolute: 3.7 10*3/uL — ABNORMAL HIGH (ref 0.7–3.1)
Lymphs: 49 %
MCH: 29.8 pg (ref 26.6–33.0)
MCHC: 33 g/dL (ref 31.5–35.7)
MCV: 90 fL (ref 79–97)
Monocytes Absolute: 0.5 10*3/uL (ref 0.1–0.9)
Monocytes: 7 %
Neutrophils Absolute: 2.8 10*3/uL (ref 1.4–7.0)
Neutrophils: 38 %
Platelets: 354 10*3/uL (ref 150–450)
RBC: 4.86 x10E6/uL (ref 3.77–5.28)
RDW: 13.1 % (ref 11.7–15.4)
WBC: 7.5 10*3/uL (ref 3.4–10.8)

## 2022-03-17 LAB — LIPID PANEL W/O CHOL/HDL RATIO
Cholesterol, Total: 190 mg/dL (ref 100–199)
HDL: 39 mg/dL — ABNORMAL LOW (ref >39)
LDL Chol Calc (NIH): 124 mg/dL — ABNORMAL HIGH (ref 0–99)
Triglycerides: 153 mg/dL — ABNORMAL HIGH (ref 0–149)
VLDL Cholesterol Cal: 27 mg/dL (ref 5–40)

## 2022-03-19 ENCOUNTER — Telehealth: Payer: Self-pay

## 2022-03-19 NOTE — Telephone Encounter (Signed)
PA approved for Nurtec 75 MG tab Notified patient via MyChart

## 2022-03-19 NOTE — Telephone Encounter (Signed)
PA initiated via CoverMyMeds for Nurtec 75MG  tab KEY: [id number] Waiting on determination.

## 2022-04-06 ENCOUNTER — Ambulatory Visit: Payer: BC Managed Care – PPO | Admitting: Family Medicine

## 2022-04-06 ENCOUNTER — Encounter: Payer: Self-pay | Admitting: Family Medicine

## 2022-04-06 ENCOUNTER — Telehealth (INDEPENDENT_AMBULATORY_CARE_PROVIDER_SITE_OTHER): Payer: BC Managed Care – PPO | Admitting: Family Medicine

## 2022-04-06 DIAGNOSIS — E119 Type 2 diabetes mellitus without complications: Secondary | ICD-10-CM

## 2022-04-06 DIAGNOSIS — G43109 Migraine with aura, not intractable, without status migrainosus: Secondary | ICD-10-CM | POA: Diagnosis not present

## 2022-04-06 NOTE — Assessment & Plan Note (Signed)
Tolerating ozempic well. Will check A1c in 6 weeks. Call with any concerns.

## 2022-04-06 NOTE — Progress Notes (Signed)
LMP 03/06/2022 (Approximate)    Subjective:    Patient ID: Mary Drake, female    DOB: Jan 10, 1981, 41 y.o.   MRN: [id number]  HPI: Mary Drake is a 41 y.o. female  Chief Complaint  Patient presents with   fmla    Needs [place] paperwork    DIABETES- remembering to take the 1x a week shot Hypoglycemic episodes:no Polydipsia/polyuria: no Visual disturbance: no Chest pain: no Paresthesias: no Glucose Monitoring: no  Accucheck frequency: Not Checking Taking Insulin?: no Blood Pressure Monitoring: not checking Retinal Examination: Up to Date Foot Exam: Not up to Date Diabetic Education: Not Completed Pneumovax: Up to Date Influenza: Up to Date Aspirin: no  MIGRAINES Duration: chronic Onset: sudden Severity: moderate Quality: sharp Frequency: intermittent, 2-3 a month Location: behind her eye Headache duration: hours Radiation: no Time of day headache occurs: at random Treatments attempted:   rest, ice, heat, APAP, ibuprofen, aleve", excedrine, triptans and amitriptyline. nurtec Aura: no Nausea:  yes Vomiting: no Photophobia:  yes Phonophobia:  yes Effect on social functioning:  yes Numbers of missed days of school/work each month: 2-3 Confusion:  no Gait disturbance/ataxia:  no Behavioral changes:  no Fevers:  no   Relevant past medical, surgical, family and social history reviewed and updated as indicated. Interim medical history since our last visit reviewed. Allergies and medications reviewed and updated.  Review of Systems  Constitutional: Negative.   Respiratory: Negative.    Cardiovascular: Negative.   Gastrointestinal: Negative.   Genitourinary: Negative.   Musculoskeletal: Negative.   Neurological:  Positive for headaches. Negative for dizziness, tremors, seizures, syncope, facial asymmetry, speech difficulty, weakness, light-headedness and numbness.  Psychiatric/Behavioral: Negative.      Per HPI unless specifically indicated  above     Objective:    LMP 03/06/2022 (Approximate)   Wt Readings from Last 3 Encounters:  03/16/22 265 lb 6.4 oz (120.4 kg)  06/29/21 264 lb 3.2 oz (119.8 kg)  03/02/21 256 lb (116.1 kg)    Physical Exam Vitals and nursing note reviewed.  Constitutional:      General: She is not in acute distress.    Appearance: Normal appearance. She is obese. She is not ill-appearing, toxic-appearing or diaphoretic.  HENT:     Head: Normocephalic and atraumatic.     Right Ear: External ear normal.     Left Ear: External ear normal.     Nose: Nose normal.     Mouth/Throat:     Mouth: Mucous membranes are moist.     Pharynx: Oropharynx is clear.  Eyes:     General: No scleral icterus.       Right eye: No discharge.        Left eye: No discharge.     Conjunctiva/sclera: Conjunctivae normal.     Pupils: Pupils are equal, round, and reactive to light.  Pulmonary:     Effort: Pulmonary effort is normal. No respiratory distress.     Comments: Speaking in full sentences Musculoskeletal:        General: Normal range of motion.     Cervical back: Normal range of motion.  Skin:    Coloration: Skin is not jaundiced or pale.     Findings: No bruising, erythema, lesion or rash.  Neurological:     Mental Status: She is alert and oriented to person, place, and time. Mental status is at baseline.  Psychiatric:        Mood and Affect: Mood normal.  Behavior: Behavior normal.        Thought Content: Thought content normal.        Judgment: Judgment normal.     Results for orders placed or performed in visit on 03/16/22  Microscopic Examination  Result Value Ref Range   WBC, UA 0-5 0 - 5 /hpf   RBC, Urine 0-2 0 - 2 /hpf   Epithelial Cells (non renal) 0-10 0 - 10 /hpf   Mucus, UA Present (A) Not Estab.   Bacteria, UA Many (A) None seen/Few   Yeast, UA Present (A) None seen  WET PREP FOR TRICH, YEAST, CLUE   Urine  Result Value Ref Range   Trichomonas Exam Negative Negative   Yeast  Exam Positive (A) Negative   Clue Cell Exam Negative Negative  Bayer DCA Hb A1c Waived  Result Value Ref Range   HB A1C (BAYER DCA - WAIVED) 9.4 (H) 4.8 - 5.6 %  Comprehensive metabolic panel  Result Value Ref Range   Glucose 220 (H) 70 - 99 mg/dL   BUN 14 6 - 24 mg/dL   Creatinine, Ser 0.77 0.57 - 1.00 mg/dL   eGFR 99 >59 mL/min/1.73   BUN/Creatinine Ratio 18 9 - 23   Sodium 137 134 - 144 mmol/L   Potassium 4.3 3.5 - 5.2 mmol/L   Chloride 99 96 - 106 mmol/L   CO2 24 20 - 29 mmol/L   Calcium 9.6 8.7 - 10.2 mg/dL   Total Protein 7.7 6.0 - 8.5 g/dL   Albumin 4.5 3.9 - 4.9 g/dL   Globulin, Total 3.2 1.5 - 4.5 g/dL   Albumin/Globulin Ratio 1.4 1.2 - 2.2   Bilirubin Total 0.2 0.0 - 1.2 mg/dL   Alkaline Phosphatase 58 44 - 121 IU/L   AST 14 0 - 40 IU/L   ALT 27 0 - 32 IU/L  CBC with Differential/Platelet  Result Value Ref Range   WBC 7.5 3.4 - 10.8 x10E3/uL   RBC 4.86 3.77 - 5.28 x10E6/uL   Hemoglobin 14.5 11.1 - 15.9 g/dL   Hematocrit 43.9 34.0 - 46.6 %   MCV 90 79 - 97 fL   MCH 29.8 26.6 - 33.0 pg   MCHC 33.0 31.5 - 35.7 g/dL   RDW 13.1 11.7 - 15.4 %   Platelets 354 150 - 450 x10E3/uL   Neutrophils 38 Not Estab. %   Lymphs 49 Not Estab. %   Monocytes 7 Not Estab. %   Eos 4 Not Estab. %   Basos 2 Not Estab. %   Neutrophils Absolute 2.8 1.4 - 7.0 x10E3/uL   Lymphocytes Absolute 3.7 (H) 0.7 - 3.1 x10E3/uL   Monocytes Absolute 0.5 0.1 - 0.9 x10E3/uL   EOS (ABSOLUTE) 0.3 0.0 - 0.4 x10E3/uL   Basophils Absolute 0.1 0.0 - 0.2 x10E3/uL   Immature Granulocytes 0 Not Estab. %   Immature Grans (Abs) 0.0 0.0 - 0.1 x10E3/uL  Lipid Panel w/o Chol/HDL Ratio  Result Value Ref Range   Cholesterol, Total 190 100 - 199 mg/dL   Triglycerides 153 (H) 0 - 149 mg/dL   HDL 39 (L) >39 mg/dL   VLDL Cholesterol Cal 27 5 - 40 mg/dL   LDL Chol Calc (NIH) 124 (H) 0 - 99 mg/dL  Urinalysis, Routine w reflex microscopic  Result Value Ref Range   Specific Gravity, UA >1.030 (H) 1.005 - 1.030    pH, UA 5.5 5.0 - 7.5   Color, UA Yellow Yellow   Appearance Ur Cloudy (A) Clear  Leukocytes,UA Negative Negative   Protein,UA 1+ (A) Negative/Trace   Glucose, UA 3+ (A) Negative   Ketones, UA Negative Negative   RBC, UA 3+ (A) Negative   Bilirubin, UA Negative Negative   Urobilinogen, Ur 0.2 0.2 - 1.0 mg/dL   Nitrite, UA Negative Negative   Microscopic Examination See below:       Assessment & Plan:   Problem List Items Addressed This Visit       Cardiovascular and Mediastinum   Migraines    Stable. Will try nurtec a little longer and see how she does with it. Will fill out FMLA paperwork for her. Call with any concerns. Continue to monitor.         Endocrine   Diabetes mellitus type 2, uncomplicated ([place])    Tolerating ozempic well. Will check A1c in 6 weeks. Call with any concerns.         Follow up plan: Return OK to cancel appt next week and reschedule for end of September (in person).   This visit was completed via video visit through MyChart due to the restrictions of the COVID-19 pandemic. All issues as above were discussed and addressed. Physical exam was done as above through visual confirmation on video through MyChart. If it was felt that the patient should be evaluated in the office, they were directed there. The patient verbally consented to this visit. Location of the patient: home Location of the provider: work Those involved with this call:  Provider: Park Liter, DO CMA:  Rocky Morel, CMA Front Desk/Registration: [employer]  Time spent on call:  25 minutes with patient face to face via video conference. More than 50% of this time was spent in counseling and coordination of care. 40 minutes total spent in review of patient's record and preparation of their chart.

## 2022-04-06 NOTE — Assessment & Plan Note (Signed)
Stable. Will try nurtec a little longer and see how she does with it. Will fill out FMLA paperwork for her. Call with any concerns. Continue to monitor.

## 2022-04-09 NOTE — Progress Notes (Signed)
Pt scheduled  

## 2022-04-12 ENCOUNTER — Telehealth: Payer: Self-pay

## 2022-04-12 NOTE — Telephone Encounter (Signed)
Placed FMLA forms in signature folder.

## 2022-04-12 NOTE — Telephone Encounter (Signed)
-----   Message from Megan P Johnson, DO sent at 04/06/2022  4:04 PM EDT ----- Should have FMLA in the pile- same as last year with 2-3x every 1-2 months for 1-8 hours or 1 day   

## 2022-04-12 NOTE — Telephone Encounter (Signed)
-----   Message from Dorcas Carrow, [place] sent at 04/06/2022  4:04 PM EDT ----- Should have FMLA in the pile- same as last year with 2-3x every 1-2 months for 1-8 hours or 1 day

## 2022-04-13 ENCOUNTER — Ambulatory Visit: Payer: BC Managed Care – PPO | Admitting: Family Medicine

## 2022-04-16 ENCOUNTER — Telehealth: Payer: Self-pay | Admitting: Family Medicine

## 2022-04-16 NOTE — Telephone Encounter (Signed)
FMLA paperwork has been completed and placed in provider folder for signature, will fax when signed.

## 2022-04-16 NOTE — Telephone Encounter (Signed)
Patient called, wanting to follow up on FMLA paper work.She states that it needs to be faxed back to employer today. Please advise patient when forms have been updated.

## 2022-04-17 ENCOUNTER — Telehealth: Payer: Self-pay

## 2022-04-17 NOTE — Telephone Encounter (Signed)
-----   Message from [place], [place] sent at 04/16/2022 10:28 AM EDT ----- FMLA due today

## 2022-04-17 NOTE — Telephone Encounter (Signed)
FMLA forms were faxed on 04/16/22, confirmation received. Patient is aware.

## 2022-04-25 ENCOUNTER — Other Ambulatory Visit: Payer: Self-pay | Admitting: Family Medicine

## 2022-04-25 ENCOUNTER — Encounter: Payer: Self-pay | Admitting: Family Medicine

## 2022-04-26 NOTE — Telephone Encounter (Signed)
Requested medication (s) are due for refill today: Yes  Requested medication (s) are on the active medication list: Yes  Last refill:  03/16/22  Future visit scheduled: Yes  Notes to clinic:  Unable to refill per protocol, tapering dosage, will need new Rx     Requested Prescriptions  Pending Prescriptions Disp Refills   OZEMPIC, 0.25 OR 0.5 MG/DOSE, 2 MG/3ML SOPN [Pharmacy Med Name: OZEMPIC 0.25-0.5 MG/DOSE PEN]      Sig: INJECT 0.25 MG INTO THE SKIN ONCE A WEEK FOR 28 DAYS, THEN 0.5 MG ONCE A WEEK FOR 28 DAYS.     Endocrinology:  Diabetes - GLP-1 Receptor Agonists - semaglutide Failed - 04/25/2022  1:06 PM      Failed - HBA1C in normal range and within 180 days    Hemoglobin A1C  Date Value Ref Range Status  08/02/2016 7.2  Final   HB A1C (BAYER DCA - WAIVED)  Date Value Ref Range Status  03/16/2022 9.4 (H) 4.8 - 5.6 % Final    Comment:             Prediabetes: 5.7 - 6.4          Diabetes: >6.4          Glycemic control for adults with diabetes: <7.0          Passed - Cr in normal range and within 360 days    Creatinine, Ser  Date Value Ref Range Status  03/16/2022 0.77 0.57 - 1.00 mg/dL Final         Passed - Valid encounter within last 6 months    Recent Outpatient Visits           2 weeks ago Migraine with aura and without status migrainosus, not intractable   [facility] [place], Megan P, DO   1 month ago Type 2 diabetes mellitus without complication, without long-term current use of insulin (HCC)   [facility] [place], Megan P, DO   3 months ago Viral upper respiratory tract infection   [facility] [place], Avanti, MD   5 months ago Type 2 diabetes mellitus without complication, without long-term current use of insulin (HCC)   [facility] [place], Megan P, DO   8 months ago Type 2 diabetes mellitus without complication, without long-term current use of insulin (HCC)   [facility] [place], [place],  DO       Future Appointments             In 3 weeks Laural Benes, Oralia Rud, DO [facility], [facility]

## 2022-04-30 MED ORDER — FLUCONAZOLE 150 MG PO TABS: 150.0000 mg | ORAL_TABLET | Freq: Every day | ORAL | 1 refills | Status: DC

## 2022-05-22 ENCOUNTER — Ambulatory Visit: Payer: BC Managed Care – PPO | Admitting: Family Medicine

## 2022-06-04 ENCOUNTER — Ambulatory Visit (INDEPENDENT_AMBULATORY_CARE_PROVIDER_SITE_OTHER): Payer: BC Managed Care – PPO | Admitting: Family Medicine

## 2022-06-04 ENCOUNTER — Encounter: Payer: Self-pay | Admitting: Family Medicine

## 2022-06-04 VITALS — BP 111/76 | HR 75 | Temp 97.9°F | Wt 264.2 lb

## 2022-06-04 DIAGNOSIS — E119 Type 2 diabetes mellitus without complications: Secondary | ICD-10-CM

## 2022-06-04 LAB — BAYER DCA HB A1C WAIVED: HB A1C (BAYER DCA - WAIVED): 8.6 % — ABNORMAL HIGH (ref 4.8–5.6)

## 2022-06-04 MED ORDER — SEMAGLUTIDE (1 MG/DOSE) 4 MG/3ML ~~LOC~~ SOPN: 1.0000 mg | PEN_INJECTOR | SUBCUTANEOUS | 1 refills | Status: DC

## 2022-06-04 NOTE — Progress Notes (Signed)
BP 111/76   Pulse 75   Temp 97.9 F (36.6 C)   Wt 264 lb 3.2 oz (119.8 kg)   SpO2 96%   BMI 48.32 kg/m    Subjective:    Patient ID: Mary Drake, female    DOB: 01/10/1981, 41 y.o.   MRN: [id number]  HPI: Mary Drake is a 41 y.o. female  Chief Complaint  Patient presents with   Diabetes   DIABETES Hypoglycemic episodes:no Polydipsia/polyuria: no Visual disturbance: no Chest pain: no Paresthesias: no Glucose Monitoring: no  Accucheck frequency: Not Checking Taking Insulin?: no Blood Pressure Monitoring: not checking Retinal Examination: Up to Date Foot Exam: Up to Date Diabetic Education: Completed Pneumovax: Up to Date Influenza: Up to Date Aspirin: no   Relevant past medical, surgical, family and social history reviewed and updated as indicated. Interim medical history since our last visit reviewed. Allergies and medications reviewed and updated.  Review of Systems  Constitutional: Negative.   Respiratory: Negative.    Cardiovascular: Negative.   Gastrointestinal: Negative.   Musculoskeletal: Negative.   Neurological: Negative.   Psychiatric/Behavioral: Negative.      Per HPI unless specifically indicated above     Objective:    BP 111/76   Pulse 75   Temp 97.9 F (36.6 C)   Wt 264 lb 3.2 oz (119.8 kg)   SpO2 96%   BMI 48.32 kg/m   Wt Readings from Last 3 Encounters:  06/04/22 264 lb 3.2 oz (119.8 kg)  03/16/22 265 lb 6.4 oz (120.4 kg)  06/29/21 264 lb 3.2 oz (119.8 kg)    Physical Exam Vitals and nursing note reviewed.  Constitutional:      General: She is not in acute distress.    Appearance: Normal appearance. She is not ill-appearing, toxic-appearing or diaphoretic.  HENT:     Head: Normocephalic and atraumatic.     Right Ear: External ear normal.     Left Ear: External ear normal.     Nose: Nose normal.     Mouth/Throat:     Mouth: Mucous membranes are moist.     Pharynx: Oropharynx is clear.  Eyes:      General: No scleral icterus.       Right eye: No discharge.        Left eye: No discharge.     Extraocular Movements: Extraocular movements intact.     Conjunctiva/sclera: Conjunctivae normal.     Pupils: Pupils are equal, round, and reactive to light.  Cardiovascular:     Rate and Rhythm: Normal rate and regular rhythm.     Pulses: Normal pulses.     Heart sounds: Normal heart sounds. No murmur heard.    No friction rub. No gallop.  Pulmonary:     Effort: Pulmonary effort is normal. No respiratory distress.     Breath sounds: Normal breath sounds. No stridor. No wheezing, rhonchi or rales.  Chest:     Chest wall: No tenderness.  Musculoskeletal:        General: Normal range of motion.     Cervical back: Normal range of motion and neck supple.  Skin:    General: Skin is warm and dry.     Capillary Refill: Capillary refill takes less than 2 seconds.     Coloration: Skin is not jaundiced or pale.     Findings: No bruising, erythema, lesion or rash.  Neurological:     General: No focal deficit present.     Mental Status: She  is alert and oriented to person, place, and time. Mental status is at baseline.  Psychiatric:        Mood and Affect: Mood normal.        Behavior: Behavior normal.        Thought Content: Thought content normal.        Judgment: Judgment normal.     Results for orders placed or performed in visit on 06/04/22  Bayer DCA Hb A1c Waived  Result Value Ref Range   HB A1C (BAYER DCA - WAIVED) 8.6 (H) 4.8 - 5.6 %      Assessment & Plan:   Problem List Items Addressed This Visit       Endocrine   Diabetes mellitus type 2, uncomplicated ([place]) - Primary    Improved with A1c of 8.6. Will increase her ozempic to 1mg  and recheck 1 month. Call with any concerns. Continue to monitor.       Relevant Medications   Semaglutide, 1 MG/DOSE, 4 MG/3ML SOPN   Other Relevant Orders   Bayer DCA Hb A1c Waived (Completed)     Follow up plan: Return in about 3 months  (around 09/04/2022).

## 2022-06-04 NOTE — Assessment & Plan Note (Signed)
Improved with A1c of 8.6. Will increase her ozempic to 1mg  and recheck 1 month. Call with any concerns. Continue to monitor.

## 2022-06-13 ENCOUNTER — Other Ambulatory Visit: Payer: Self-pay | Admitting: Family Medicine

## 2022-06-13 NOTE — Telephone Encounter (Signed)
dc'd 06/04/22 Dr Wynetta Emery (Dose changed)  Requested Prescriptions  Refused Prescriptions Disp Refills  . OZEMPIC, 0.25 OR 0.5 MG/DOSE, 2 MG/3ML SOPN [Pharmacy Med Name: OZEMPIC 0.25-0.5 MG/DOSE PEN]      Sig: INJECT 0.25 MG INTO THE SKIN ONCE A WEEK FOR 28 DAYS, THEN 0.5 MG ONCE A WEEK FOR 28 DAYS.     Endocrinology:  Diabetes - GLP-1 Receptor Agonists - semaglutide Failed - 06/13/2022 11:20 AM      Failed - HBA1C in normal range and within 180 days    Hemoglobin A1C  Date Value Ref Range Status  08/02/2016 7.2  Final   HB A1C (BAYER DCA - WAIVED)  Date Value Ref Range Status  06/04/2022 8.6 (H) 4.8 - 5.6 % Final    Comment:             Prediabetes: 5.7 - 6.4          Diabetes: >6.4          Glycemic control for adults with diabetes: <7.0          Passed - Cr in normal range and within 360 days    Creatinine, Ser  Date Value Ref Range Status  03/16/2022 0.77 0.57 - 1.00 mg/dL Final         Passed - Valid encounter within last 6 months    Recent Outpatient Visits          1 week ago Type 2 diabetes mellitus without complication, without long-term current use of insulin ([place])   [place], Megan P, DO   2 months ago Migraine with aura and without status migrainosus, not intractable   [employer], Megan P, DO   2 months ago Type 2 diabetes mellitus without complication, without long-term current use of insulin ([place])   [place], Megan P, DO   4 months ago Viral upper respiratory tract infection   [facility] [place], Avanti, MD   6 months ago Type 2 diabetes mellitus without complication, without long-term current use of insulin ([place])   [place], [place], DO      Future Appointments            In 2 days Wynetta Emery, Barb Merino, DO [facility], [facility]   In 2 months Wynetta Emery, Barb Merino, DO [facility] [facility], [facility]

## 2022-06-14 ENCOUNTER — Telehealth: Payer: Self-pay

## 2022-06-14 NOTE — Telephone Encounter (Signed)
Patient is scheduled for virtual tomorrow at 11:20 for possible strep throat. Please change to office visit if patient calls back.

## 2022-06-15 ENCOUNTER — Telehealth: Payer: BC Managed Care – PPO | Admitting: Family Medicine

## 2022-06-15 NOTE — Telephone Encounter (Signed)
Pt cancelled her appointment and states she is feeling better

## 2022-06-18 ENCOUNTER — Encounter: Payer: Self-pay | Admitting: Family Medicine

## 2022-06-21 ENCOUNTER — Encounter: Payer: Self-pay | Admitting: Nurse Practitioner

## 2022-06-21 ENCOUNTER — Ambulatory Visit: Payer: BC Managed Care – PPO | Admitting: Nurse Practitioner

## 2022-06-21 VITALS — BP 122/84 | HR 86 | Temp 98.8°F | Wt 250.8 lb

## 2022-06-21 DIAGNOSIS — N760 Acute vaginitis: Secondary | ICD-10-CM | POA: Diagnosis not present

## 2022-06-21 DIAGNOSIS — H6992 Unspecified Eustachian tube disorder, left ear: Secondary | ICD-10-CM | POA: Diagnosis not present

## 2022-06-21 DIAGNOSIS — B9689 Other specified bacterial agents as the cause of diseases classified elsewhere: Secondary | ICD-10-CM | POA: Insufficient documentation

## 2022-06-21 DIAGNOSIS — R399 Unspecified symptoms and signs involving the genitourinary system: Secondary | ICD-10-CM | POA: Insufficient documentation

## 2022-06-21 DIAGNOSIS — R8281 Pyuria: Secondary | ICD-10-CM

## 2022-06-21 LAB — URINALYSIS, ROUTINE W REFLEX MICROSCOPIC
Bilirubin, UA: NEGATIVE
Glucose, UA: NEGATIVE
Ketones, UA: NEGATIVE
Nitrite, UA: NEGATIVE
Specific Gravity, UA: 1.015 (ref 1.005–1.030)
Urobilinogen, Ur: 0.2 mg/dL (ref 0.2–1.0)
pH, UA: 6.5 (ref 5.0–7.5)

## 2022-06-21 LAB — MICROSCOPIC EXAMINATION

## 2022-06-21 LAB — WET PREP FOR TRICH, YEAST, CLUE
Clue Cell Exam: POSITIVE — AB
Trichomonas Exam: NEGATIVE
Yeast Exam: NEGATIVE

## 2022-06-21 MED ORDER — METRONIDAZOLE 500 MG PO TABS: 500.0000 mg | ORAL_TABLET | Freq: Two times a day (BID) | ORAL | 0 refills | Status: AC

## 2022-06-21 NOTE — Assessment & Plan Note (Signed)
Acute and noted on wet prep.  Will send in Flagyl 100 MG BID for 7 days.  Educated them on findings and treatment plan.  All questions answered.

## 2022-06-21 NOTE — Assessment & Plan Note (Signed)
Acute, post URI.  Recent A1c 8.6% -- will avoid Prednisone use.  Recommend she continue to use her Zyrtec and nasal spray at home -- highly recommend she use daily.  Educated them on findings and treatment plan.  All questions answered.

## 2022-06-21 NOTE — Patient Instructions (Signed)

## 2022-06-21 NOTE — Assessment & Plan Note (Signed)
Acute for 4 days.  UA  3+ BLD (on cycle), 2+ PRT, 3+ LEUKS, few bacteria.  Wet prep negative Trich and yeast, but positive clue cells.  Will send in Flagyl 100 MG BID for 7 days.  Send urine for culture and treat as needed based on results.  Recommend increased hydration at home and cranberry tablets.  Educated on results and treatment plan.

## 2022-06-21 NOTE — Progress Notes (Signed)
Acute Office Visit  Subjective:     Patient ID: Mary Drake, female    DOB: 1981-01-18, 41 y.o.   MRN: [id number]  Chief Complaint  Patient presents with   Urinary Tract Infection    Patient is here for possible UTI. Patient says last week she did not feel well. Patient says she started feeling a little better, but Sunday she started to feel pressure in her lower abdomen and noticed her urine was cloudy. Patient Monday she noticed a odor with the urine.     Ear Problem    Patient says she thinks she has fluid behind her L ear as she presses it, she can hear it.     Presents for concern for urine infection, started with symptoms on Sunday (06/17/22).  Started to get odor to it on Monday.  Has pressure to pelvic area and back discomfort lower aspect.  Has history of urine infections in past, last 2-3 months ago.  Having ear fullness, feeling fluid when she presses in it, no pain with this.  Was sick last week with viral illness.  Urinary Tract Infection  This is a new problem. The current episode started in the past 7 days. The problem occurs every urination. The problem has been unchanged. The pain is at a severity of 0/10. The patient is experiencing no pain. There has been no fever. She is Sexually active. There is No history of pyelonephritis. Associated symptoms include chills and frequency. Pertinent negatives include no discharge, flank pain, hematuria, hesitancy, nausea, sweats, urgency or vomiting. She has tried increased fluids and acetaminophen for the symptoms. The treatment provided no relief. There is no history of kidney stones or recurrent UTIs.  Ear Fullness  There is pain in the left ear. This is a new problem. The current episode started in the past 7 days. The problem occurs constantly. The problem has been unchanged. There has been no fever. The pain is at a severity of 0/10. The patient is experiencing no pain. Pertinent negatives include no abdominal pain,  coughing, diarrhea, ear discharge, headaches, hearing loss, rhinorrhea, sore throat or vomiting. She has tried acetaminophen for the symptoms. The treatment provided no relief. There is no history of a chronic ear infection or hearing loss.   Patient is in today for urinary tract symptoms and ear discomfort.  Review of Systems  Constitutional:  Positive for chills. Negative for fever and malaise/fatigue.  HENT:  Positive for congestion. Negative for ear discharge, hearing loss, rhinorrhea, sinus pain and sore throat.   Respiratory:  Negative for cough, shortness of breath and wheezing.   Cardiovascular: Negative.   Gastrointestinal:  Negative for abdominal pain, constipation, diarrhea, nausea and vomiting.  Genitourinary:  Positive for frequency. Negative for flank pain, hematuria, hesitancy and urgency.  Neurological:  Negative for headaches.  Psychiatric/Behavioral: Negative.          Objective:    BP 122/84   Pulse 86 Comment: apical  Temp 98.8 F (37.1 C) (Oral)   Wt 250 lb 12.8 oz (113.8 kg)   SpO2 95%   BMI 45.87 kg/m  BP Readings from Last 3 Encounters:  06/21/22 122/84  06/04/22 111/76  03/16/22 121/83   Wt Readings from Last 3 Encounters:  06/21/22 250 lb 12.8 oz (113.8 kg)  06/04/22 264 lb 3.2 oz (119.8 kg)  03/16/22 265 lb 6.4 oz (120.4 kg)   Physical Exam Vitals and nursing note reviewed.  Constitutional:      General: She is  awake. She is not in acute distress.    Appearance: She is well-developed and well-groomed. She is obese. She is not ill-appearing or toxic-appearing.  HENT:     Head: Normocephalic.     Right Ear: Hearing normal.     Left Ear: Hearing normal.  Eyes:     General: Lids are normal.        Right eye: No discharge.        Left eye: No discharge.     Conjunctiva/sclera: Conjunctivae normal.     Pupils: Pupils are equal, round, and reactive to light.  Neck:     Thyroid: No thyromegaly.     Vascular: No carotid bruit.  Cardiovascular:      Rate and Rhythm: Normal rate and regular rhythm.     Heart sounds: Normal heart sounds. No murmur heard.    No gallop.  Pulmonary:     Effort: Pulmonary effort is normal. No accessory muscle usage or respiratory distress.     Breath sounds: Normal breath sounds.  Abdominal:     General: Bowel sounds are normal. There is no distension.     Palpations: Abdomen is soft.     Tenderness: There is no abdominal tenderness. There is no right CVA tenderness or left CVA tenderness.  Musculoskeletal:     Cervical back: Normal range of motion and neck supple.     Right lower leg: No edema.     Left lower leg: No edema.  Lymphadenopathy:     Cervical: No cervical adenopathy.  Skin:    General: Skin is warm and dry.  Neurological:     Mental Status: She is alert and oriented to person, place, and time.  Psychiatric:        Attention and Perception: Attention normal.        Mood and Affect: Mood normal.        Speech: Speech normal.        Behavior: Behavior normal. Behavior is cooperative.        Thought Content: Thought content normal.    Results for orders placed or performed in visit on 06/21/22  WET PREP FOR TRICH, YEAST, CLUE   Specimen: Genital   Genital  Result Value Ref Range   Trichomonas Exam Negative Negative   Yeast Exam Negative Negative   Clue Cell Exam Positive (A) Negative  Microscopic Examination   Urine  Result Value Ref Range   WBC, UA 6-10 (A) 0 - 5 /hpf   RBC, Urine 3-10 (A) 0 - 2 /hpf   Epithelial Cells (non renal) 0-10 0 - 10 /hpf   Bacteria, UA Few None seen/Few  Urinalysis, Routine w reflex microscopic  Result Value Ref Range   Specific Gravity, UA 1.015 1.005 - 1.030   pH, UA 6.5 5.0 - 7.5   Color, UA Yellow Yellow   Appearance Ur Turbid (A) Clear   Leukocytes,UA 3+ (A) Negative   Protein,UA 2+ (A) Negative/Trace   Glucose, UA Negative Negative   Ketones, UA Negative Negative   RBC, UA 3+ (A) Negative   Bilirubin, UA Negative Negative    Urobilinogen, Ur 0.2 0.2 - 1.0 mg/dL   Nitrite, UA Negative Negative   Microscopic Examination See below:        Assessment & Plan:   Problem List Items Addressed This Visit       Nervous and Auditory   Eustachian tube dysfunction, left    Acute, post URI.  Recent A1c 8.6% -- will avoid  Prednisone use.  Recommend she continue to use her Zyrtec and nasal spray at home -- highly recommend she use daily.  Educated them on findings and treatment plan.  All questions answered.        Genitourinary   Bacterial vaginosis    Acute and noted on wet prep.  Will send in Flagyl 100 MG BID for 7 days.  Educated them on findings and treatment plan.  All questions answered.      Relevant Medications   metroNIDAZOLE (FLAGYL) 500 MG tablet     Other   Urinary symptom or sign - Primary    Acute for 4 days.  UA  3+ BLD (on cycle), 2+ PRT, 3+ LEUKS, few bacteria.  Wet prep negative Trich and yeast, but positive clue cells.  Will send in Flagyl 100 MG BID for 7 days.  Send urine for culture and treat as needed based on results.  Recommend increased hydration at home and cranberry tablets.  Educated on results and treatment plan.      Relevant Orders   WET PREP FOR [place], YEAST, CLUE (Completed)   Urinalysis, Routine w reflex microscopic (Completed)   Other Visit Diagnoses     Pyuria       Send urine for culture and treat as needed based on results.   Relevant Orders   Urine Culture       Meds ordered this encounter  Medications   metroNIDAZOLE (FLAGYL) 500 MG tablet    Sig: Take 1 tablet (500 mg total) by mouth 2 (two) times daily for 7 days.    Dispense:  14 tablet    Refill:  0    Return if symptoms worsen or fail to improve.  Venita Lick, NP

## 2022-06-25 ENCOUNTER — Other Ambulatory Visit: Payer: Self-pay | Admitting: Nurse Practitioner

## 2022-06-25 LAB — URINE CULTURE

## 2022-06-25 MED ORDER — NITROFURANTOIN MONOHYD MACRO 100 MG PO CAPS: 100.0000 mg | ORAL_CAPSULE | Freq: Two times a day (BID) | ORAL | 0 refills | Status: AC

## 2022-06-25 NOTE — Progress Notes (Signed)
Contacted via [facility], it looks like you also have a urine infection.  I have sent in another antibiotic to cover this aspect.  Continue Flagyl and new antibiotic until fully complete.  Any questions?

## 2022-09-04 ENCOUNTER — Ambulatory Visit: Payer: BC Managed Care – PPO | Admitting: Family Medicine

## 2022-09-04 ENCOUNTER — Encounter: Payer: Self-pay | Admitting: Family Medicine

## 2022-09-04 VITALS — BP 97/66 | HR 76 | Temp 98.4°F | Ht 63.5 in | Wt 250.4 lb

## 2022-09-04 DIAGNOSIS — E119 Type 2 diabetes mellitus without complications: Secondary | ICD-10-CM

## 2022-09-04 DIAGNOSIS — E1165 Type 2 diabetes mellitus with hyperglycemia: Secondary | ICD-10-CM | POA: Diagnosis not present

## 2022-09-04 DIAGNOSIS — Z0000 Encounter for general adult medical examination without abnormal findings: Secondary | ICD-10-CM | POA: Diagnosis not present

## 2022-09-04 DIAGNOSIS — Z1231 Encounter for screening mammogram for malignant neoplasm of breast: Secondary | ICD-10-CM | POA: Diagnosis not present

## 2022-09-04 DIAGNOSIS — G43109 Migraine with aura, not intractable, without status migrainosus: Secondary | ICD-10-CM | POA: Diagnosis not present

## 2022-09-04 LAB — URINALYSIS, ROUTINE W REFLEX MICROSCOPIC
Bilirubin, UA: NEGATIVE
Glucose, UA: NEGATIVE
Ketones, UA: NEGATIVE
Leukocytes,UA: NEGATIVE
Nitrite, UA: NEGATIVE
Protein,UA: NEGATIVE
RBC, UA: NEGATIVE
Specific Gravity, UA: >1.03 — ABNORMAL HIGH (ref 1.005–1.030)
Urobilinogen, Ur: 0.2 mg/dL (ref 0.2–1.0)
pH, UA: 5 (ref 5.0–7.5)

## 2022-09-04 LAB — MICROALBUMIN, URINE WAIVED
Creatinine, Urine Waived: 100 mg/dL (ref 10–300)
Microalb, Ur Waived: 30 mg/L — ABNORMAL HIGH (ref 0–19)
Microalb/Creat Ratio: <30 mg/g (ref <30)

## 2022-09-04 LAB — BAYER DCA HB A1C WAIVED: HB A1C (BAYER DCA - WAIVED): 8.7 % — ABNORMAL HIGH (ref 4.8–5.6)

## 2022-09-04 MED ORDER — OMEPRAZOLE 20 MG PO CPDR: 20.0000 mg | DELAYED_RELEASE_CAPSULE | Freq: Every day | ORAL | 3 refills | Status: DC

## 2022-09-04 NOTE — Progress Notes (Signed)
BP 97/66   Pulse 76   Temp 98.4 F (36.9 C) (Oral)   Ht 5' 3.5" (1.613 m)   Wt 250 lb 6.4 oz (113.6 kg)   SpO2 98%   BMI 43.66 kg/m    Subjective:    Patient ID: Mary Drake, female    DOB: [birth year], 42 y.o.   MRN: [id number]  HPI: Mary Drake is a 42 y.o. female presenting on 09/04/2022 for comprehensive medical examination. Current medical complaints include:  DIABETES Hypoglycemic episodes:no Polydipsia/polyuria: yes Visual disturbance: no Chest pain: no Paresthesias: no Glucose Monitoring: no  Accucheck frequency: Not Checking Taking Insulin?: no Blood Pressure Monitoring: not checking Retinal Examination: Up to Date Foot Exam: Up to Date Diabetic Education: Completed Pneumovax: Up to Date Influenza: Not Up to Date Aspirin: no  GERD GERD control status: controlled Satisfied with current treatment? yes Medication side effects: no  Medication compliance: excellent Dysphagia: no Odynophagia:  no Hematemesis: no Blood in stool: no EGD: no  Migraines has been OK. Getting them about 3-4x a month. Nurtec working well.  Menopausal Symptoms: no  Depression Screen done today and results listed below:     09/04/2022    4:07 PM 06/21/2022   10:55 AM 03/16/2022    8:34 AM 01/18/2022    1:05 PM 06/29/2021   11:13 AM  Depression screen PHQ 2/9  Decreased Interest 0 0 1 0 1  Down, Depressed, Hopeless 1 0 1 0 1  PHQ - 2 Score 1 0 2 0 2  Altered sleeping 0 1 0 0 0  Tired, decreased energy 1 1 1  0 1  Change in appetite 1 1 0 0 0  Feeling bad or failure about yourself  1 0 0 0 1  Trouble concentrating 0 0 0 0 0  Moving slowly or fidgety/restless 0 0 0 0 0  Suicidal thoughts 0 0 0  0  PHQ-9 Score 4 3 3  0 4  Difficult doing work/chores Not difficult at all Somewhat difficult Somewhat difficult Not difficult at all Not difficult at all    Past Medical History:  Past Medical History:  Diagnosis Date   Migraines    Psoriasis     Surgical  History:  Past Surgical History:  Procedure Laterality Date   CHOLECYSTECTOMY  2010    Medications:  Current Outpatient Medications on File Prior to Visit  Medication Sig   azelastine (ASTELIN) 0.1 % nasal spray Place 2 sprays into both nostrils 2 (two) times daily. Use in each nostril as directed   cetirizine (ZYRTEC) 10 MG tablet Take 10 mg by mouth daily.   nortriptyline (PAMELOR) 25 MG capsule Take 1-4 capsules (25-100 mg total) by mouth at bedtime.   nystatin (MYCOSTATIN/NYSTOP) powder Apply 1 Application topically 3 (three) times daily.   Rimegepant Sulfate (NURTEC) 75 MG TBDP Take 75 mg by mouth daily as needed (migraine).   Semaglutide, 1 MG/DOSE, 4 MG/3ML SOPN Inject 1 mg as directed once a week.   TALTZ 80 MG/ML SOAJ    No current facility-administered medications on file prior to visit.    Allergies:  Allergies  Allergen Reactions   Codeine     Social History:  Social History   Socioeconomic History   Marital status: Single    Spouse name: Not on file   Number of children: Not on file   Years of education: Not on file   Highest education level: Not on file  Occupational History   Not on file  Tobacco Use   Smoking status: Never   Smokeless tobacco: Never  Vaping Use   Vaping Use: Never used  Substance and Sexual Activity   Alcohol use: Yes    Comment: 0n occasion   Drug use: No   Sexual activity: Yes    Birth control/protection: None  Other Topics Concern   Not on file  Social History Narrative   Not on file   Social Determinants of Health   Financial Resource Strain: Not on file  Food Insecurity: Not on file  Transportation Needs: Not on file  Physical Activity: Not on file  Stress: Not on file  Social Connections: Not on file  Intimate Partner Violence: Not on file   Social History   Tobacco Use  Smoking Status Never  Smokeless Tobacco Never   Social History   Substance and Sexual Activity  Alcohol Use Yes   Comment: 0n occasion     Family History:  Family History  Problem Relation Age of Onset   Diabetes Mother    Hypertension Mother    Diabetes Father    Heart disease Father    Heart failure Father     Past medical history, surgical history, medications, allergies, family history and social history reviewed with patient today and changes made to appropriate areas of the chart.   Review of Systems  Constitutional: Negative.   HENT: Negative.    Eyes: Negative.   Respiratory: Negative.    Cardiovascular: Negative.   Gastrointestinal:  Positive for abdominal pain, heartburn, nausea and vomiting. Negative for blood in stool, constipation, diarrhea and melena.  Genitourinary: Negative.   Musculoskeletal: Negative.   Skin:  Positive for rash. Negative for itching.  Neurological:  Positive for headaches. Negative for dizziness, tingling, tremors, sensory change, speech change, focal weakness, seizures, loss of consciousness and weakness.  Endo/Heme/Allergies:  Negative for environmental allergies and polydipsia. Does not bruise/bleed easily.  Psychiatric/Behavioral: Negative.     All other ROS negative except what is listed above and in the HPI.      Objective:    BP 97/66   Pulse 76   Temp 98.4 F (36.9 C) (Oral)   Ht 5' 3.5" (1.613 m)   Wt 250 lb 6.4 oz (113.6 kg)   SpO2 98%   BMI 43.66 kg/m   Wt Readings from Last 3 Encounters:  09/04/22 250 lb 6.4 oz (113.6 kg)  06/21/22 250 lb 12.8 oz (113.8 kg)  06/04/22 264 lb 3.2 oz (119.8 kg)    Physical Exam  Results for orders placed or performed in visit on 09/04/22  Microalbumin, Urine Waived  Result Value Ref Range   Microalb, Ur Waived 30 (H) 0 - 19 mg/L   Creatinine, Urine Waived 100 10 - 300 mg/dL   Microalb/Creat Ratio <30 <30 mg/g  Bayer DCA Hb A1c Waived  Result Value Ref Range   HB A1C (BAYER DCA - WAIVED) 8.7 (H) 4.8 - 5.6 %  CBC with Differential/Platelet  Result Value Ref Range   WBC 11.1 (H) 3.4 - 10.8 x10E3/uL   RBC 4.71 3.77  - 5.28 x10E6/uL   Hemoglobin 14.0 11.1 - 15.9 g/dL   Hematocrit 42.9 34.0 - 46.6 %   MCV 91 79 - 97 fL   MCH 29.7 26.6 - 33.0 pg   MCHC 32.6 31.5 - 35.7 g/dL   RDW 12.7 11.7 - 15.4 %   Platelets 416 150 - 450 x10E3/uL   Neutrophils 60 Not Estab. %   Lymphs 30 Not Estab. %  Monocytes 6 Not Estab. %   Eos 3 Not Estab. %   Basos 1 Not Estab. %   Neutrophils Absolute 6.7 1.4 - 7.0 x10E3/uL   Lymphocytes Absolute 3.4 (H) 0.7 - 3.1 x10E3/uL   Monocytes Absolute 0.6 0.1 - 0.9 x10E3/uL   EOS (ABSOLUTE) 0.3 0.0 - 0.4 x10E3/uL   Basophils Absolute 0.1 0.0 - 0.2 x10E3/uL   Immature Granulocytes 0 Not Estab. %   Immature Grans (Abs) 0.0 0.0 - 0.1 x10E3/uL  Comprehensive metabolic panel  Result Value Ref Range   Glucose 164 (H) 70 - 99 mg/dL   BUN 10 6 - 24 mg/dL   Creatinine, Ser 3.47 0.57 - 1.00 mg/dL   eGFR 425 >95 GL/OVF/6.43   BUN/Creatinine Ratio 14 9 - 23   Sodium 138 134 - 144 mmol/L   Potassium 4.2 3.5 - 5.2 mmol/L   Chloride 99 96 - 106 mmol/L   CO2 24 20 - 29 mmol/L   Calcium 9.3 8.7 - 10.2 mg/dL   Total Protein 7.3 6.0 - 8.5 g/dL   Albumin 4.2 3.9 - 4.9 g/dL   Globulin, Total 3.1 1.5 - 4.5 g/dL   Albumin/Globulin Ratio 1.4 1.2 - 2.2   Bilirubin Total 0.3 0.0 - 1.2 mg/dL   Alkaline Phosphatase 61 44 - 121 IU/L   AST 22 0 - 40 IU/L   ALT 30 0 - 32 IU/L  Lipid Panel w/o Chol/HDL Ratio  Result Value Ref Range   Cholesterol, Total 168 100 - 199 mg/dL   Triglycerides 329 (H) 0 - 149 mg/dL   HDL 32 (L) >51 mg/dL   VLDL Cholesterol Cal 43 (H) 5 - 40 mg/dL   LDL Chol Calc (NIH) 93 0 - 99 mg/dL  Urinalysis, Routine w reflex microscopic  Result Value Ref Range   Specific Gravity, UA >1.030 (H) 1.005 - 1.030   pH, UA 5.0 5.0 - 7.5   Color, UA Yellow Yellow   Appearance Ur Clear Clear   Leukocytes,UA Negative Negative   Protein,UA Negative Negative/Trace   Glucose, UA Negative Negative   Ketones, UA Negative Negative   RBC, UA Negative Negative   Bilirubin, UA Negative  Negative   Urobilinogen, Ur 0.2 0.2 - 1.0 mg/dL   Nitrite, UA Negative Negative   Microscopic Examination Comment   TSH  Result Value Ref Range   TSH 1.890 0.450 - 4.500 uIU/mL      Assessment & Plan:   Problem List Items Addressed This Visit       Cardiovascular and Mediastinum   Migraines    Under good control on current regimen. Continue current regimen. Continue to monitor. Call with any concerns. Refills given.          Endocrine   Uncontrolled type 2 diabetes mellitus with hyperglycemia (HCC)    Still not under good control with A1c of 8.7. Will increase her ozempic to 1mg  and recheck 3 months. Call with any concerns. Continue to monitor.       Relevant Orders   Microalbumin, Urine Waived (Completed)   Bayer DCA Hb A1c Waived (Completed)   Other Visit Diagnoses     Routine general medical examination at a health care facility    -  Primary   Vaccines up to date. Screening labs checked today. Pap up to date. Mammo ordered. Continue diet and exercise. Call with any concerns.   Relevant Orders   Microalbumin, Urine Waived (Completed)   Bayer DCA Hb A1c Waived (Completed)   CBC with  Differential/Platelet (Completed)   Comprehensive metabolic panel (Completed)   Lipid Panel w/o Chol/HDL Ratio (Completed)   Urinalysis, Routine w reflex microscopic (Completed)   TSH (Completed)   Encounter for screening mammogram for malignant neoplasm of breast       Mammogram ordered today. Await results.   Relevant Orders   MM 3D SCREEN BREAST BILATERAL        Follow up plan: Return in about 3 months (around 12/04/2022).   LABORATORY TESTING:  - Pap smear: up to date  IMMUNIZATIONS:   - Tdap: Tetanus vaccination status reviewed: last tetanus booster within 10 years. - Influenza: Refused - Pneumovax: Up to date - Prevnar: Not applicable - COVID: Up to date  SCREENING: -Mammogram: Ordered today   PATIENT COUNSELING:   Advised to take 1 mg of folate supplement per day  if capable of pregnancy.   Sexuality: Discussed sexually transmitted diseases, partner selection, use of condoms, avoidance of unintended pregnancy  and contraceptive alternatives.   Advised to avoid cigarette smoking.  I discussed with the patient that most people either abstain from alcohol or drink within safe limits (<=14/week and <=4 drinks/occasion for males, <=7/weeks and <= 3 drinks/occasion for females) and that the risk for alcohol disorders and other health effects rises proportionally with the number of drinks per week and how often a drinker exceeds daily limits.  Discussed cessation/primary prevention of drug use and availability of treatment for abuse.   Diet: Encouraged to adjust caloric intake to maintain  or achieve ideal body weight, to reduce intake of dietary saturated fat and total fat, to limit sodium intake by avoiding high sodium foods and not adding table salt, and to maintain adequate dietary potassium and calcium preferably from fresh fruits, vegetables, and low-fat dairy products.    stressed the importance of regular exercise  Injury prevention: Discussed safety belts, safety helmets, smoke detector, smoking near bedding or upholstery.   Dental health: Discussed importance of regular tooth brushing, flossing, and dental visits.    NEXT PREVENTATIVE PHYSICAL DUE IN 1 YEAR. Return in about 3 months (around 12/04/2022).

## 2022-09-04 NOTE — Patient Instructions (Signed)
Please call to schedule your mammogram and/or bone density: [facility]  Address: [address], [place], [place] [address] Phone: [phone]  [facility] [place] [address] [place],  [place]  [address] Phone: [phone]   

## 2022-09-05 LAB — COMPREHENSIVE METABOLIC PANEL
ALT: 30 IU/L (ref 0–32)
AST: 22 IU/L (ref 0–40)
Albumin/Globulin Ratio: 1.4 (ref 1.2–2.2)
Albumin: 4.2 g/dL (ref 3.9–4.9)
Alkaline Phosphatase: 61 IU/L (ref 44–121)
BUN/Creatinine Ratio: 14 (ref 9–23)
BUN: 10 mg/dL (ref 6–24)
Bilirubin Total: 0.3 mg/dL (ref 0.0–1.2)
CO2: 24 mmol/L (ref 20–29)
Calcium: 9.3 mg/dL (ref 8.7–10.2)
Chloride: 99 mmol/L (ref 96–106)
Creatinine, Ser: 0.72 mg/dL (ref 0.57–1.00)
Globulin, Total: 3.1 g/dL (ref 1.5–4.5)
Glucose: 164 mg/dL — ABNORMAL HIGH (ref 70–99)
Potassium: 4.2 mmol/L (ref 3.5–5.2)
Sodium: 138 mmol/L (ref 134–144)
Total Protein: 7.3 g/dL (ref 6.0–8.5)
eGFR: 108 mL/min/{1.73_m2} (ref >59)

## 2022-09-05 LAB — CBC WITH DIFFERENTIAL/PLATELET
Basophils Absolute: 0.1 x10E3/uL (ref 0.0–0.2)
Basos: 1 %
EOS (ABSOLUTE): 0.3 x10E3/uL (ref 0.0–0.4)
Eos: 3 %
Hematocrit: 42.9 % (ref 34.0–46.6)
Hemoglobin: 14 g/dL (ref 11.1–15.9)
Immature Grans (Abs): 0 x10E3/uL (ref 0.0–0.1)
Immature Granulocytes: 0 %
Lymphocytes Absolute: 3.4 x10E3/uL — ABNORMAL HIGH (ref 0.7–3.1)
Lymphs: 30 %
MCH: 29.7 pg (ref 26.6–33.0)
MCHC: 32.6 g/dL (ref 31.5–35.7)
MCV: 91 fL (ref 79–97)
Monocytes Absolute: 0.6 x10E3/uL (ref 0.1–0.9)
Monocytes: 6 %
Neutrophils Absolute: 6.7 x10E3/uL (ref 1.4–7.0)
Neutrophils: 60 %
Platelets: 416 x10E3/uL (ref 150–450)
RBC: 4.71 x10E6/uL (ref 3.77–5.28)
RDW: 12.7 % (ref 11.7–15.4)
WBC: 11.1 x10E3/uL — ABNORMAL HIGH (ref 3.4–10.8)

## 2022-09-05 LAB — TSH: TSH: 1.89 u[IU]/mL (ref 0.450–4.500)

## 2022-09-05 LAB — LIPID PANEL W/O CHOL/HDL RATIO
Cholesterol, Total: 168 mg/dL (ref 100–199)
HDL: 32 mg/dL — ABNORMAL LOW
LDL Chol Calc (NIH): 93 mg/dL (ref 0–99)
Triglycerides: 251 mg/dL — ABNORMAL HIGH (ref 0–149)
VLDL Cholesterol Cal: 43 mg/dL — ABNORMAL HIGH (ref 5–40)

## 2022-09-09 NOTE — Assessment & Plan Note (Signed)
Still not under good control with A1c of 8.7. Will increase her ozempic to 1mg  and recheck 3 months. Call with any concerns. Continue to monitor.

## 2022-09-09 NOTE — Assessment & Plan Note (Signed)
Under good control on current regimen. Continue current regimen. Continue to monitor. Call with any concerns. Refills given.   

## 2022-11-30 ENCOUNTER — Other Ambulatory Visit: Payer: Self-pay | Admitting: Family Medicine

## 2022-11-30 NOTE — Telephone Encounter (Signed)
Requested Prescriptions  Pending Prescriptions Disp Refills   omeprazole (PRILOSEC) 20 MG capsule [Pharmacy Med Name: OMEPRAZOLE DR 20 MG CAPSULE] 90 capsule 1    Sig: TAKE 1 CAPSULE BY MOUTH EVERY DAY     Gastroenterology: Proton Pump Inhibitors Passed - 11/30/2022  2:14 AM      Passed - Valid encounter within last 12 months    Recent Outpatient Visits           2 months ago Routine general medical examination at a health care facility   [facility], Megan P, DO   5 months ago Urinary symptom or sign   [facility] [place], [place] T, NP   5 months ago Type 2 diabetes mellitus without complication, without long-term current use of insulin (HCC)   [facility], Megan P, DO   7 months ago Migraine with aura and without status migrainosus, not intractable   [place] [facility] [place], Megan P, DO   8 months ago Type 2 diabetes mellitus without complication, without long-term current use of insulin (HCC)   [place] [facility], Oralia Rud, DO       Future Appointments             In 4 days Dorcas Carrow, DO  [facility]

## 2022-12-04 ENCOUNTER — Encounter: Payer: Self-pay | Admitting: Family Medicine

## 2022-12-04 ENCOUNTER — Ambulatory Visit: Payer: BC Managed Care – PPO | Admitting: Family Medicine

## 2022-12-04 VITALS — BP 121/79 | HR 71 | Temp 98.3°F | Ht 63.5 in | Wt 258.0 lb

## 2022-12-04 DIAGNOSIS — G8929 Other chronic pain: Secondary | ICD-10-CM | POA: Diagnosis not present

## 2022-12-04 DIAGNOSIS — M25561 Pain in right knee: Secondary | ICD-10-CM | POA: Diagnosis not present

## 2022-12-04 DIAGNOSIS — E1165 Type 2 diabetes mellitus with hyperglycemia: Secondary | ICD-10-CM

## 2022-12-04 LAB — BAYER DCA HB A1C WAIVED: HB A1C (BAYER DCA - WAIVED): 8.5 % — ABNORMAL HIGH (ref 4.8–5.6)

## 2022-12-04 MED ORDER — SEMAGLUTIDE (2 MG/DOSE) 8 MG/3ML ~~LOC~~ SOPN: 2.0000 mg | PEN_INJECTOR | SUBCUTANEOUS | 1 refills | Status: DC

## 2022-12-04 NOTE — Assessment & Plan Note (Signed)
Improved with A1c of 8.5 down from 8.7, but still uncontrolled. Will increase her ozempic to 2mg  and recheck 3 months. Call with any concerns.

## 2022-12-04 NOTE — Progress Notes (Signed)
BP 121/79   Pulse 71   Temp 98.3 F (36.8 C) (Oral)   Ht 5' 3.5" (1.613 m)   Wt 258 lb (117 kg)   SpO2 98%   BMI 44.99 kg/m    Subjective:    Patient ID: Mary Drake, female    DOB: [birth year], 42 y.o.   MRN: [id number]  HPI: Mary Drake is a 42 y.o. female  Chief Complaint  Patient presents with   Diabetes   R knee conitnues to hurt and has been feeling tight in her calf and behind her knee  DIABETES Hypoglycemic episodes:no Polydipsia/polyuria: no Visual disturbance: no Chest pain: no Paresthesias: no Glucose Monitoring: no  Accucheck frequency: Not Checking Taking Insulin?: no Blood Pressure Monitoring: not checking Retinal Examination: Not up to Date Foot Exam: Up to Date Diabetic Education: Completed Pneumovax: Up to Date Influenza: Up to Date Aspirin: no   Relevant past medical, surgical, family and social history reviewed and updated as indicated. Interim medical history since our last visit reviewed. Allergies and medications reviewed and updated.  Review of Systems  Constitutional: Negative.   Respiratory: Negative.    Cardiovascular: Negative.   Gastrointestinal: Negative.   Musculoskeletal:  Positive for arthralgias and joint swelling. Negative for back pain, gait problem, myalgias, neck pain and neck stiffness.  Psychiatric/Behavioral: Negative.      Per HPI unless specifically indicated above     Objective:    BP 121/79   Pulse 71   Temp 98.3 F (36.8 C) (Oral)   Ht 5' 3.5" (1.613 m)   Wt 258 lb (117 kg)   SpO2 98%   BMI 44.99 kg/m   Wt Readings from Last 3 Encounters:  12/04/22 258 lb (117 kg)  09/04/22 250 lb 6.4 oz (113.6 kg)  06/21/22 250 lb 12.8 oz (113.8 kg)    Physical Exam Vitals and nursing note reviewed.  Constitutional:      General: Mary Drake is not in acute distress.    Appearance: Normal appearance. Mary Drake is not ill-appearing, toxic-appearing or diaphoretic.  HENT:     Head: Normocephalic and  atraumatic.     Right Ear: External ear normal.     Left Ear: External ear normal.     Nose: Nose normal.     Mouth/Throat:     Mouth: Mucous membranes are moist.     Pharynx: Oropharynx is clear.  Eyes:     General: No scleral icterus.       Right eye: No discharge.        Left eye: No discharge.     Extraocular Movements: Extraocular movements intact.     Conjunctiva/sclera: Conjunctivae normal.     Pupils: Pupils are equal, round, and reactive to light.  Cardiovascular:     Rate and Rhythm: Normal rate and regular rhythm.     Pulses: Normal pulses.     Heart sounds: Normal heart sounds. No murmur heard.    No friction rub. No gallop.  Pulmonary:     Effort: Pulmonary effort is normal. No respiratory distress.     Breath sounds: Normal breath sounds. No stridor. No wheezing, rhonchi or rales.  Chest:     Chest wall: No tenderness.  Musculoskeletal:        General: Normal range of motion.     Cervical back: Normal range of motion and neck supple.  Skin:    General: Skin is warm and dry.     Capillary Refill: Capillary refill takes less than  2 seconds.     Coloration: Skin is not jaundiced or pale.     Findings: No bruising, erythema, lesion or rash.  Neurological:     General: No focal deficit present.     Mental Status: Mary Drake is alert and oriented to person, place, and time. Mental status is at baseline.  Psychiatric:        Mood and Affect: Mood normal.        Behavior: Behavior normal.        Thought Content: Thought content normal.        Judgment: Judgment normal.     Results for orders placed or performed in visit on 09/04/22  Microalbumin, Urine Waived  Result Value Ref Range   Microalb, Ur Waived 30 (H) 0 - 19 mg/L   Creatinine, Urine Waived 100 10 - 300 mg/dL   Microalb/Creat Ratio <30 <30 mg/g  Bayer DCA Hb A1c Waived  Result Value Ref Range   HB A1C (BAYER DCA - WAIVED) 8.7 (H) 4.8 - 5.6 %  CBC with Differential/Platelet  Result Value Ref Range   WBC  11.1 (H) 3.4 - 10.8 x10E3/uL   RBC 4.71 3.77 - 5.28 x10E6/uL   Hemoglobin 14.0 11.1 - 15.9 g/dL   Hematocrit 16.142.9 09.634.0 - 46.6 %   MCV 91 79 - 97 fL   MCH 29.7 26.6 - 33.0 pg   MCHC 32.6 31.5 - 35.7 g/dL   RDW 04.512.7 40.911.7 - 81.115.4 %   Platelets 416 150 - 450 x10E3/uL   Neutrophils 60 Not Estab. %   Lymphs 30 Not Estab. %   Monocytes 6 Not Estab. %   Eos 3 Not Estab. %   Basos 1 Not Estab. %   Neutrophils Absolute 6.7 1.4 - 7.0 x10E3/uL   Lymphocytes Absolute 3.4 (H) 0.7 - 3.1 x10E3/uL   Monocytes Absolute 0.6 0.1 - 0.9 x10E3/uL   EOS (ABSOLUTE) 0.3 0.0 - 0.4 x10E3/uL   Basophils Absolute 0.1 0.0 - 0.2 x10E3/uL   Immature Granulocytes 0 Not Estab. %   Immature Grans (Abs) 0.0 0.0 - 0.1 x10E3/uL  Comprehensive metabolic panel  Result Value Ref Range   Glucose 164 (H) 70 - 99 mg/dL   BUN 10 6 - 24 mg/dL   Creatinine, Ser 9.140.72 0.57 - 1.00 mg/dL   eGFR 782108 >95>59 AO/ZHY/8.65mL/min/1.73   BUN/Creatinine Ratio 14 9 - 23   Sodium 138 134 - 144 mmol/L   Potassium 4.2 3.5 - 5.2 mmol/L   Chloride 99 96 - 106 mmol/L   CO2 24 20 - 29 mmol/L   Calcium 9.3 8.7 - 10.2 mg/dL   Total Protein 7.3 6.0 - 8.5 g/dL   Albumin 4.2 3.9 - 4.9 g/dL   Globulin, Total 3.1 1.5 - 4.5 g/dL   Albumin/Globulin Ratio 1.4 1.2 - 2.2   Bilirubin Total 0.3 0.0 - 1.2 mg/dL   Alkaline Phosphatase 61 44 - 121 IU/L   AST 22 0 - 40 IU/L   ALT 30 0 - 32 IU/L  Lipid Panel w/o Chol/HDL Ratio  Result Value Ref Range   Cholesterol, Total 168 100 - 199 mg/dL   Triglycerides 784251 (H) 0 - 149 mg/dL   HDL 32 (L) >69>39 mg/dL   VLDL Cholesterol Cal 43 (H) 5 - 40 mg/dL   LDL Chol Calc (NIH) 93 0 - 99 mg/dL  Urinalysis, Routine w reflex microscopic  Result Value Ref Range   Specific Gravity, UA >1.030 (H) 1.005 - 1.030   pH, UA  5.0 5.0 - 7.5   Color, UA Yellow Yellow   Appearance Ur Clear Clear   Leukocytes,UA Negative Negative   Protein,UA Negative Negative/Trace   Glucose, UA Negative Negative   Ketones, UA Negative Negative   RBC, UA  Negative Negative   Bilirubin, UA Negative Negative   Urobilinogen, Ur 0.2 0.2 - 1.0 mg/dL   Nitrite, UA Negative Negative   Microscopic Examination Comment   TSH  Result Value Ref Range   TSH 1.890 0.450 - 4.500 uIU/mL      Assessment & Plan:   Problem List Items Addressed This Visit       Endocrine   Uncontrolled type 2 diabetes mellitus with hyperglycemia - Primary    Improved with A1c of 8.5 down from 8.7, but still uncontrolled. Will increase her ozempic to 2mg  and recheck 3 months. Call with any concerns.       Relevant Medications   Semaglutide, 2 MG/DOSE, 8 MG/3ML SOPN   Other Relevant Orders   Bayer DCA Hb A1c Waived   Other Visit Diagnoses     Chronic pain of right knee       Referral to ortho placed today. Call with any concerns.   Relevant Orders   Ambulatory referral to Orthopedic Surgery        Follow up plan: Return in about 3 months (around 03/05/2023).

## 2023-01-07 ENCOUNTER — Ambulatory Visit
Admission: RE | Admit: 2023-01-07 | Discharge: 2023-01-07 | Disposition: A | Discharge status: Home / Self Care | Payer: BC Managed Care – PPO | Source: Ambulatory Visit | Attending: Family Medicine | Admitting: Family Medicine

## 2023-01-07 DIAGNOSIS — Z1231 Encounter for screening mammogram for malignant neoplasm of breast: Secondary | ICD-10-CM | POA: Insufficient documentation

## 2023-01-30 IMAGING — DX DG KNEE COMPLETE 4+V*R*
4 series · 4 of 4 positions shown · non-contrast
Comparison: None.

CLINICAL DATA: Chronic right knee pain.

EXAM:
RIGHT KNEE - COMPLETE 4+ VIEW

[knee tunnel]
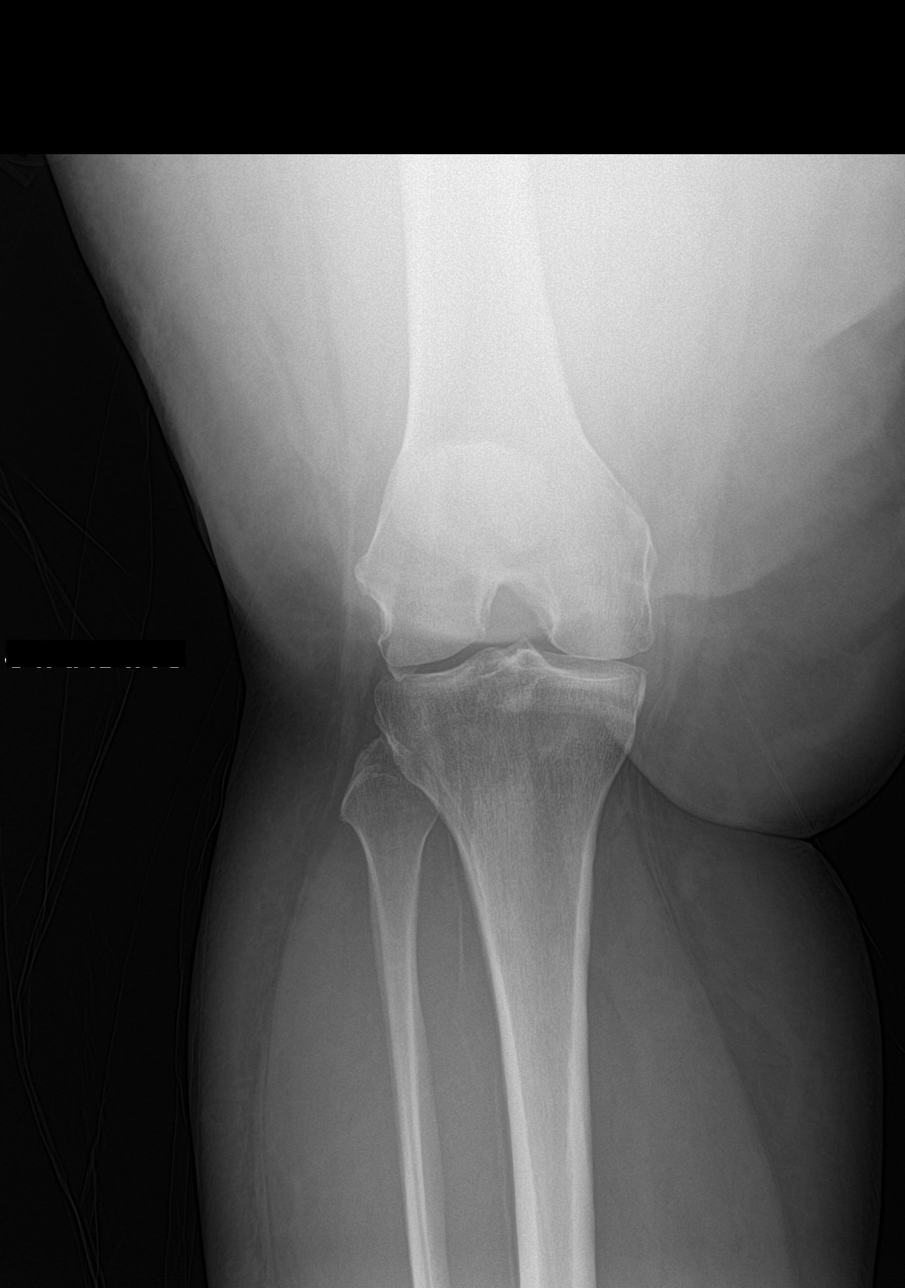

[knee lat]
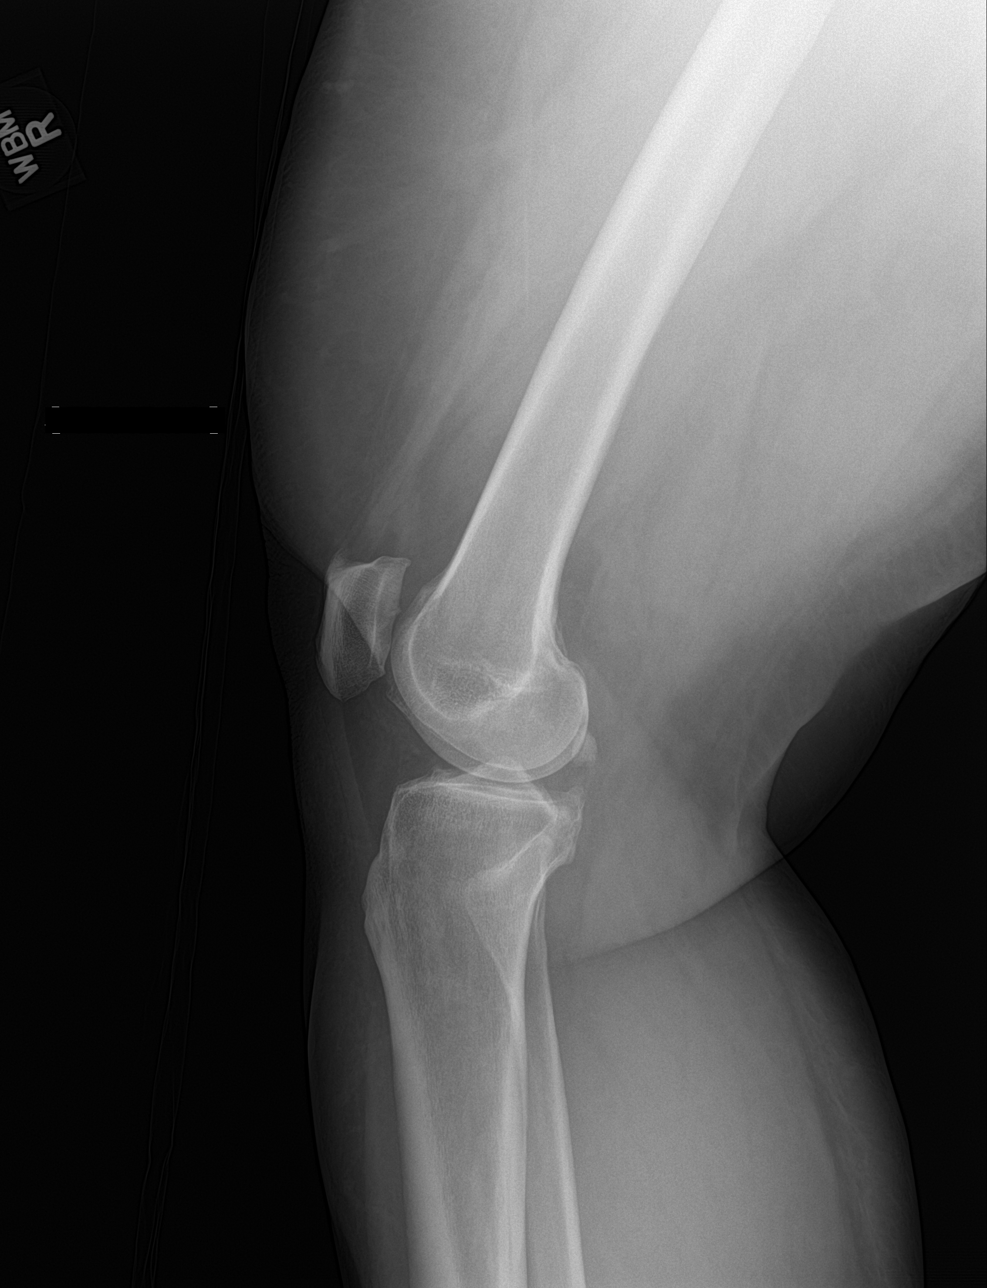

[patella skyline]
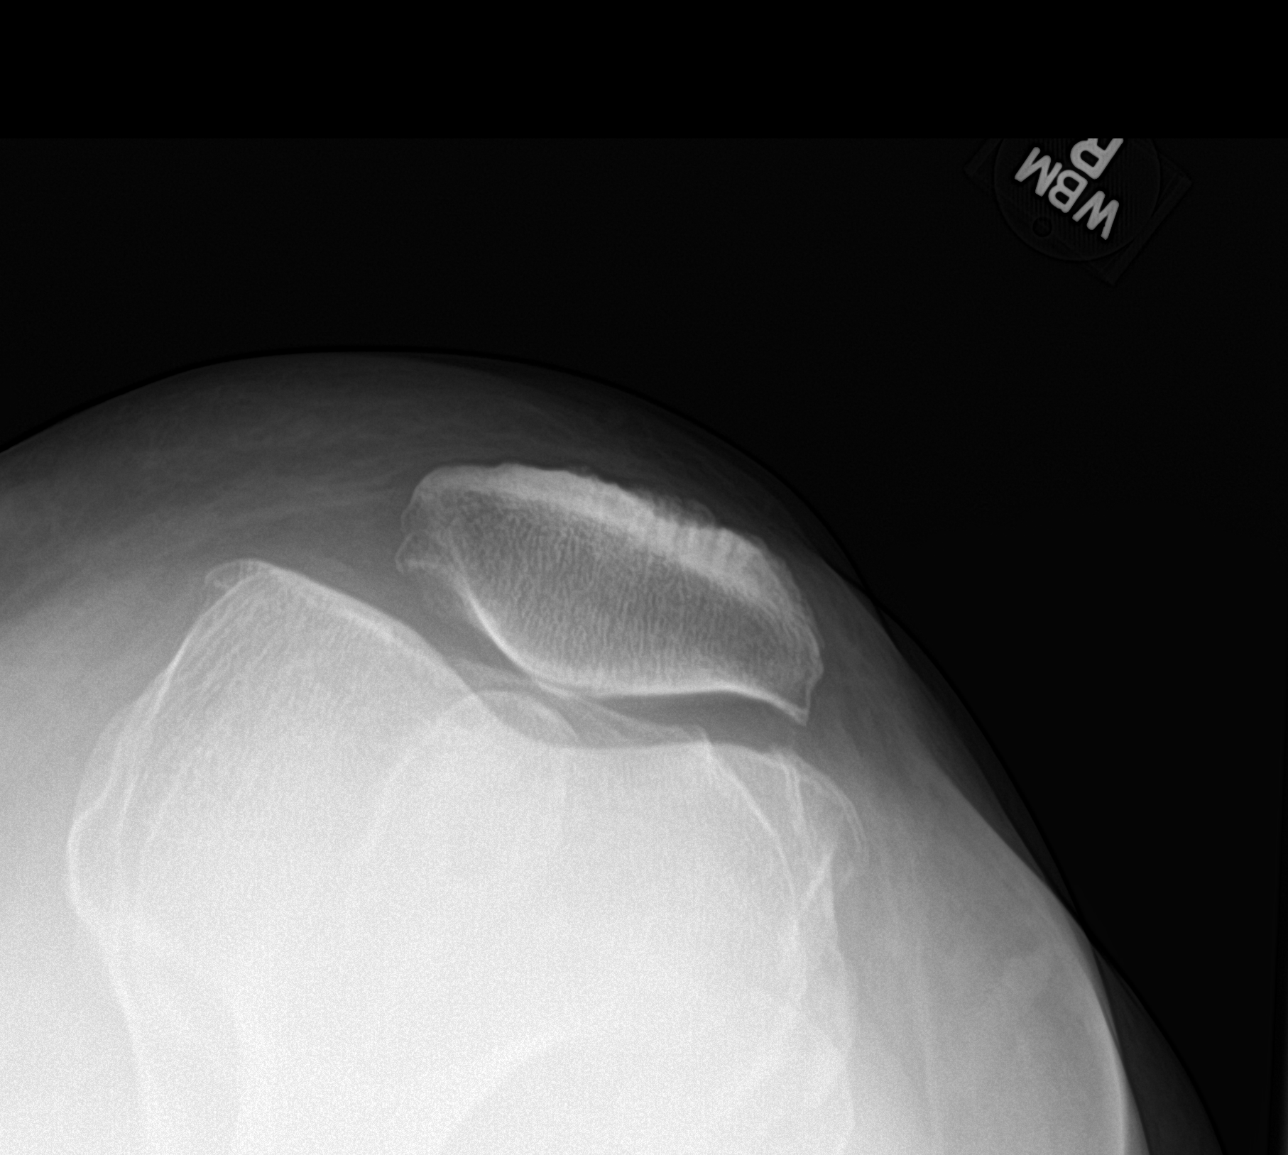

[knee ap]
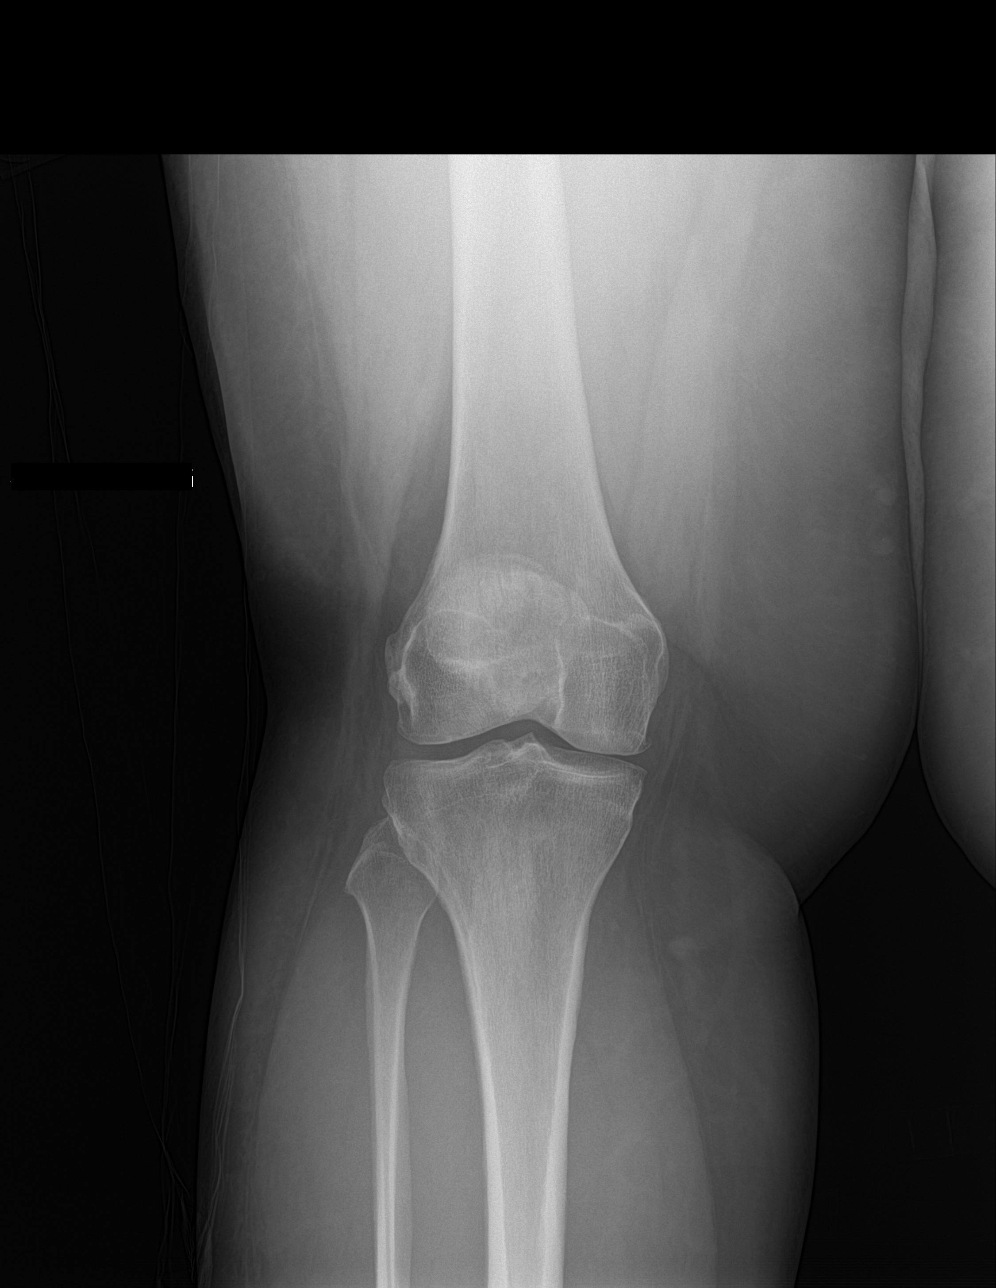

[4 of 4 positions shown; findings below may reference images not displayed]

FINDINGS: No evidence of fracture, dislocation, or joint effusion. Small
tricompartmental osteophytes. Joint spaces are relatively well
preserved. Soft tissues are unremarkable.
IMPRESSION: No acute finding.  Mild tricompartmental osteoarthritis.

## 2023-03-11 ENCOUNTER — Encounter: Payer: Self-pay | Admitting: Family Medicine

## 2023-03-11 ENCOUNTER — Ambulatory Visit: Payer: BC Managed Care – PPO | Admitting: Family Medicine

## 2023-03-11 VITALS — BP 123/86 | HR 101 | Temp 97.9°F | Wt 251.2 lb

## 2023-03-11 DIAGNOSIS — N926 Irregular menstruation, unspecified: Secondary | ICD-10-CM

## 2023-03-11 DIAGNOSIS — E1165 Type 2 diabetes mellitus with hyperglycemia: Secondary | ICD-10-CM

## 2023-03-11 DIAGNOSIS — G43109 Migraine with aura, not intractable, without status migrainosus: Secondary | ICD-10-CM

## 2023-03-11 LAB — BAYER DCA HB A1C WAIVED: HB A1C (BAYER DCA - WAIVED): 7.9 % — ABNORMAL HIGH (ref 4.8–5.6)

## 2023-03-11 MED ORDER — TIRZEPATIDE 10 MG/0.5ML ~~LOC~~ SOAJ: 10.0000 mg | SUBCUTANEOUS | 1 refills | Status: DC

## 2023-03-11 MED ORDER — NURTEC 75 MG PO TBDP: 75.0000 mg | ORAL_TABLET | Freq: Every day | ORAL | 12 refills | Status: DC | PRN

## 2023-03-11 MED ORDER — NORTRIPTYLINE HCL 25 MG PO CAPS: 25.0000 mg | ORAL_CAPSULE | Freq: Every day | ORAL | 1 refills | Status: DC

## 2023-03-11 MED ORDER — NYSTATIN 100000 UNIT/GM EX POWD: 1.0000 | Freq: Three times a day (TID) | CUTANEOUS | 3 refills | Status: AC

## 2023-03-11 MED ORDER — MELOXICAM 15 MG PO TABS: 15.0000 mg | ORAL_TABLET | Freq: Every day | ORAL | 1 refills | Status: DC

## 2023-03-11 NOTE — Assessment & Plan Note (Signed)
 Under good control on current regimen. Continue current regimen. Continue to monitor. Call with any concerns. Refills given.   

## 2023-03-11 NOTE — Progress Notes (Signed)
BP 123/86   Pulse (!) 101   Temp 97.9 F (36.6 C) (Oral)   Wt 251 lb 3.2 oz (113.9 kg)   LMP 11/14/2022 (Approximate)   SpO2 97%   BMI 43.80 kg/m    Subjective:    Patient ID: Mary Drake, female    DOB: 23-Jan-1981, 42 y.o.   MRN: [id number]  HPI: Mary Drake is a 42 y.o. female  Chief Complaint  Patient presents with   Diabetes   DIABETES Hypoglycemic episodes:no Polydipsia/polyuria: no Visual disturbance: no Chest pain: no Paresthesias: no Glucose Monitoring: yes Taking Insulin?: no Blood Pressure Monitoring: not checking Retinal Examination: Not up to Date Foot Exam: Up to Date Diabetic Education: Completed Pneumovax: Up to Date Influenza: Up to Date Aspirin: no  MIGRAINES Duration: chronic Onset: sudden Severity: moderate Quality: sharp Frequency: occasionally Location: behind her L eye Headache duration: hours Radiation: no Headache status at time of visit: asymptomatic Aura: no Nausea:  no Vomiting: no Photophobia:  no Phonophobia:  no Effect on social functioning:  yes Confusion:  no Gait disturbance/ataxia:  no Behavioral changes:  no Fevers:  no   Relevant past medical, surgical, family and social history reviewed and updated as indicated. Interim medical history since our last visit reviewed. Allergies and medications reviewed and updated.  Review of Systems  Constitutional: Negative.   Respiratory: Negative.    Cardiovascular: Negative.   Gastrointestinal: Negative.   Musculoskeletal: Negative.   Psychiatric/Behavioral: Negative.      Per HPI unless specifically indicated above     Objective:    BP 123/86   Pulse (!) 101   Temp 97.9 F (36.6 C) (Oral)   Wt 251 lb 3.2 oz (113.9 kg)   LMP 11/14/2022 (Approximate)   SpO2 97%   BMI 43.80 kg/m   Wt Readings from Last 3 Encounters:  03/11/23 251 lb 3.2 oz (113.9 kg)  12/04/22 258 lb (117 kg)  09/04/22 250 lb 6.4 oz (113.6 kg)    Physical Exam Vitals  and nursing note reviewed.  Constitutional:      General: She is not in acute distress.    Appearance: Normal appearance. She is obese. She is not ill-appearing, toxic-appearing or diaphoretic.  HENT:     Head: Normocephalic and atraumatic.     Right Ear: External ear normal.     Left Ear: External ear normal.     Nose: Nose normal.     Mouth/Throat:     Mouth: Mucous membranes are moist.     Pharynx: Oropharynx is clear.  Eyes:     General: No scleral icterus.       Right eye: No discharge.        Left eye: No discharge.     Extraocular Movements: Extraocular movements intact.     Conjunctiva/sclera: Conjunctivae normal.     Pupils: Pupils are equal, round, and reactive to light.  Cardiovascular:     Rate and Rhythm: Normal rate and regular rhythm.     Pulses: Normal pulses.     Heart sounds: Normal heart sounds. No murmur heard.    No friction rub. No gallop.  Pulmonary:     Effort: Pulmonary effort is normal. No respiratory distress.     Breath sounds: Normal breath sounds. No stridor. No wheezing, rhonchi or rales.  Chest:     Chest wall: No tenderness.  Musculoskeletal:        General: Normal range of motion.     Cervical back: Normal range  of motion and neck supple.  Skin:    General: Skin is warm and dry.     Capillary Refill: Capillary refill takes less than 2 seconds.     Coloration: Skin is not jaundiced or pale.     Findings: No bruising, erythema, lesion or rash.  Neurological:     General: No focal deficit present.     Mental Status: She is alert and oriented to person, place, and time. Mental status is at baseline.  Psychiatric:        Mood and Affect: Mood normal.        Behavior: Behavior normal.        Thought Content: Thought content normal.        Judgment: Judgment normal.     Results for orders placed or performed in visit on 03/11/23  Bayer DCA Hb A1c Waived  Result Value Ref Range   HB A1C (BAYER DCA - WAIVED) 7.9 (H) 4.8 - 5.6 %       Assessment & Plan:   Problem List Items Addressed This Visit       Cardiovascular and Mediastinum   Migraines    Under good control on current regimen. Continue current regimen. Continue to monitor. Call with any concerns. Refills given.        Relevant Medications   nortriptyline (PAMELOR) 25 MG capsule   Rimegepant Sulfate (NURTEC) 75 MG TBDP   meloxicam (MOBIC) 15 MG tablet     Endocrine   Uncontrolled type 2 diabetes mellitus with hyperglycemia (HCC) - Primary    Improved with A1c of 7.9 down from 8.5. Will change from ozempic to [facility] and recheck 3 months. Call with any concerns. Continue to monitor.       Relevant Medications   tirzepatide (MOUNJARO) 10 MG/0.5ML Pen   Other Relevant Orders   Bayer DCA Hb A1c Waived (Completed)   CBC with Differential/Platelet   Comprehensive metabolic panel   Lipid Panel w/o Chol/HDL Ratio   Other Visit Diagnoses     Abnormal menstrual periods       Will check labs. Await results.   Relevant Orders   LH   FSH   Estradiol        Follow up plan: Return in about 3 months (around 06/11/2023).

## 2023-03-11 NOTE — Assessment & Plan Note (Signed)
Improved with A1c of 7.9 down from 8.5. Will change from ozempic to [facility] and recheck 3 months. Call with any concerns. Continue to monitor.

## 2023-03-12 LAB — CBC WITH DIFFERENTIAL/PLATELET
Basophils Absolute: 0.1 10*3/uL (ref 0.0–0.2)
Basos: 1 %
EOS (ABSOLUTE): 0.3 10*3/uL (ref 0.0–0.4)
Eos: 3 %
Hematocrit: 42.1 % (ref 34.0–46.6)
Hemoglobin: 13.6 g/dL (ref 11.1–15.9)
Immature Grans (Abs): 0 10*3/uL (ref 0.0–0.1)
Immature Granulocytes: 0 %
Lymphocytes Absolute: 3.2 10*3/uL — ABNORMAL HIGH (ref 0.7–3.1)
Lymphs: 32 %
MCH: 29.2 pg (ref 26.6–33.0)
MCHC: 32.3 g/dL (ref 31.5–35.7)
MCV: 91 fL (ref 79–97)
Monocytes Absolute: 0.6 10*3/uL (ref 0.1–0.9)
Monocytes: 6 %
Neutrophils Absolute: 5.8 10*3/uL (ref 1.4–7.0)
Neutrophils: 58 %
Platelets: 384 10*3/uL (ref 150–450)
RBC: 4.65 x10E6/uL (ref 3.77–5.28)
RDW: 12.8 % (ref 11.7–15.4)
WBC: 10 10*3/uL (ref 3.4–10.8)

## 2023-03-12 LAB — COMPREHENSIVE METABOLIC PANEL
ALT: 30 IU/L (ref 0–32)
AST: 22 IU/L (ref 0–40)
Albumin: 4.3 g/dL (ref 3.9–4.9)
Alkaline Phosphatase: 58 IU/L (ref 44–121)
BUN/Creatinine Ratio: 16 (ref 9–23)
BUN: 16 mg/dL (ref 6–24)
Bilirubin Total: 0.3 mg/dL (ref 0.0–1.2)
CO2: 23 mmol/L (ref 20–29)
Calcium: 9.4 mg/dL (ref 8.7–10.2)
Chloride: 99 mmol/L (ref 96–106)
Creatinine, Ser: 1.01 mg/dL — ABNORMAL HIGH (ref 0.57–1.00)
Globulin, Total: 3.3 g/dL (ref 1.5–4.5)
Glucose: 188 mg/dL — ABNORMAL HIGH (ref 70–99)
Potassium: 4.3 mmol/L (ref 3.5–5.2)
Sodium: 139 mmol/L (ref 134–144)
Total Protein: 7.6 g/dL (ref 6.0–8.5)
eGFR: 71 mL/min/{1.73_m2} (ref >59)

## 2023-03-12 LAB — LUTEINIZING HORMONE: LH: 22.8 m[IU]/mL

## 2023-03-12 LAB — FOLLICLE STIMULATING HORMONE: FSH: 32.2 m[IU]/mL

## 2023-03-12 LAB — LIPID PANEL W/O CHOL/HDL RATIO
Cholesterol, Total: 160 mg/dL (ref 100–199)
HDL: 36 mg/dL — ABNORMAL LOW (ref >39)
LDL Chol Calc (NIH): 90 mg/dL (ref 0–99)
Triglycerides: 200 mg/dL — ABNORMAL HIGH (ref 0–149)
VLDL Cholesterol Cal: 34 mg/dL (ref 5–40)

## 2023-03-12 LAB — ESTRADIOL: Estradiol: 7.7 pg/mL

## 2023-03-13 ENCOUNTER — Telehealth: Payer: Self-pay

## 2023-04-01 ENCOUNTER — Telehealth: Payer: Self-pay

## 2023-04-01 NOTE — Telephone Encounter (Signed)
PA approved. Called and LVM notifying patient of approval.

## 2023-04-01 NOTE — Telephone Encounter (Signed)
PA for [employer]ur[employer] initiated and submitted via Cover My Meds. Key: [id number]

## 2023-04-02 NOTE — Telephone Encounter (Signed)
Error

## 2023-04-10 ENCOUNTER — Encounter: Payer: Self-pay | Admitting: Family Medicine

## 2023-04-12 ENCOUNTER — Encounter: Payer: Self-pay | Admitting: Family Medicine

## 2023-04-16 ENCOUNTER — Telehealth: Payer: Self-pay | Admitting: Family Medicine

## 2023-04-16 NOTE — Telephone Encounter (Signed)
Called and informed patient that paperwork was faxed back 8/19

## 2023-04-16 NOTE — Telephone Encounter (Signed)
Patient has called to check the status of the FMLA forms, she sent a MyChart messgae with the forms attached. Advised patient forms were printed and put in PCP box -- per MyChart message. Patient states the FMLA forms are due on 04/19/2023. Please advise and document status.  Please call patient with an update @ # [phone]

## 2023-04-19 NOTE — Telephone Encounter (Signed)
Pt called back reporting that she was told that her FMLA paperwork was never received and that they are requesting for her PCP's office to re-fax the documents. She says this is due today, she would greatly appreciate if this could be done today.

## 2023-04-19 NOTE — Telephone Encounter (Unsigned)
Copied from CRM [id number]. Topic: General - Other >> Apr 19, 2023  3:32 PM Ja-Kwan M wrote: Reason for CRM: Pt called once again to report that her case worker told her that the [facility] paperwork was never received so she is requesting that the the documents be re-faxed to [phone]. Pt stated that it is due today.

## 2023-04-23 ENCOUNTER — Telehealth: Payer: Self-pay | Admitting: Family Medicine

## 2023-04-23 NOTE — Telephone Encounter (Signed)
Patient came by and stated that her FMLA paperwork was never received so she is requesting that the the documents be completed and re-faxed to [phone]. I am placing forms in Dr. Henriette Combs folder.

## 2023-04-26 NOTE — Telephone Encounter (Signed)
Patient came to pickup paperwork this morning. Refaxed by office manager.

## 2023-05-23 ENCOUNTER — Other Ambulatory Visit: Payer: Self-pay | Admitting: Family Medicine

## 2023-05-23 NOTE — Telephone Encounter (Signed)
Unable to refill per protocol, Rx expired. Discontinued 03/11/23.  Requested Prescriptions  Pending Prescriptions Disp Refills   OZEMPIC, 2 MG/DOSE, 8 MG/3ML SOPN [Pharmacy Med Name: OZEMPIC 8 MG/3 ML (2 MG/DOSE)]  1    Sig: INJECT 2 MG AS DIRECTED ONCE A WEEK.     Endocrinology:  Diabetes - GLP-1 Receptor Agonists - semaglutide Failed - 05/23/2023  2:32 AM      Failed - HBA1C in normal range and within 180 days    Hemoglobin A1C  Date Value Ref Range Status  08/02/2016 7.2  Final   HB A1C (BAYER DCA - WAIVED)  Date Value Ref Range Status  03/11/2023 7.9 (H) 4.8 - 5.6 % Final    Comment:             Prediabetes: 5.7 - 6.4          Diabetes: >6.4          Glycemic control for adults with diabetes: <7.0          Failed - Cr in normal range and within 360 days    Creatinine, Ser  Date Value Ref Range Status  03/11/2023 1.01 (H) 0.57 - 1.00 mg/dL Final         Passed - Valid encounter within last 6 months    Recent Outpatient Visits           2 months ago Uncontrolled type 2 diabetes mellitus with hyperglycemia (HCC)   [place] [facility] [place], Megan P, DO   5 months ago Uncontrolled type 2 diabetes mellitus with hyperglycemia (HCC)   [facility] [place], Megan P, DO   8 months ago Routine general medical examination at a health care facility   [facility], Megan P, DO   11 months ago Urinary symptom or sign   [place] [facility] [place], [place] T, NP   11 months ago Type 2 diabetes mellitus without complication, without long-term current use of insulin (HCC)   [place] [facility] [place], Oralia Rud, DO       Future Appointments             In 2 weeks Laural Benes, Oralia Rud, DO [facility], [facility]

## 2023-05-29 ENCOUNTER — Other Ambulatory Visit: Payer: Self-pay | Admitting: Family Medicine

## 2023-05-29 NOTE — Telephone Encounter (Signed)
Medication Refill - Medication: tirzepatide Greggory Keen) 10 MG/0.5ML Pen    Has the patient contacted their pharmacy? no  [id number] [place], [place] - [address]  [address] [place], [place] [address]  Phone:  [phone]  Fax:  [phone]  DEA #:  [id number] erred Pharmacy (with phone number or street name):       Has the patient been seen for an appointment in the last year OR does the patient have an upcoming appointment? Yes.    Agent: Please be advised that RX refills may take up to 3 business days. We ask that you follow-up with your pharmacy.

## 2023-05-29 NOTE — Telephone Encounter (Unsigned)
Copied from CRM [id number]. Topic: General - Other >> May 29, 2023 12:21 PM Phill Myron wrote: If authorization is needed please call   [employer]  [id number]   for Tirzepatide [facility]) 10 MG/0.5ML Pen [[id number]]

## 2023-05-29 NOTE — Addendum Note (Signed)
Addended by: Wilford Corner on: 05/29/2023 01:10 PM   Modules accepted: Orders

## 2023-05-30 MED ORDER — TIRZEPATIDE 10 MG/0.5ML ~~LOC~~ SOAJ: 10.0000 mg | SUBCUTANEOUS | 1 refills | Status: DC

## 2023-05-30 NOTE — Telephone Encounter (Signed)
Requested medication (s) are due for refill today: yes  Requested medication (s) are on the active medication list: yes yes Last refill:  03/11/23  Future visit scheduled: yes  Medication not assigned to a protocol, review manually.  Notes to clinic:       Requested Prescriptions  Pending Prescriptions Disp Refills   tirzepatide (MOUNJARO) 10 MG/0.5ML Pen 6 mL 1    Sig: Inject 10 mg into the skin once a week.     Off-Protocol Failed - 05/29/2023  2:05 PM      Failed - Medication not assigned to a protocol, review manually.      Passed - Valid encounter within last 12 months    Recent Outpatient Visits           2 months ago Uncontrolled type 2 diabetes mellitus with hyperglycemia (HCC)   [place] [facility] [place], Megan P, DO   5 months ago Uncontrolled type 2 diabetes mellitus with hyperglycemia [facility])   [place] [facility] [place], Megan P, DO   8 months ago Routine general medical examination at a health care facility   [facility], Megan P, DO   11 months ago Urinary symptom or sign   [place] [facility] [place], [place] T, NP   12 months ago Type 2 diabetes mellitus without complication, without long-term current use of insulin (HCC)   [place] [facility] [place], Oralia Rud, DO       Future Appointments             In 1 week Laural Benes, Oralia Rud, DO [place] [facility], [facility]

## 2023-06-03 ENCOUNTER — Ambulatory Visit: Payer: Self-pay | Admitting: *Deleted

## 2023-06-03 NOTE — Telephone Encounter (Signed)
Chief Complaint: Greggory Keen rx Disposition: [] ED /[] Urgent Care (no appt availability in office) / [] Appointment(In office/virtual)/ []  [place] [facility]/ [] Home Care/ [] Refused Recommended Disposition /[] [place] Mobile [facility]/ [x]  Follow-up with PCP Additional Notes:  [facility] and [facility] [place] about the refill(s) mounjaro. She says patient never had mounjaro and it's requiring a PA and should start with a lower dose. Advised I will send this to the provider.  Summary: Rx clarification   [place] with [facility] called for clarification/verification of the Rx for tirzepatide [facility]) 10 MG/0.5ML Pen. Cb# [phone]       Reason for Disposition  [1] Pharmacy calling with prescription question AND [2] triager unable to answer question  Answer Assessment - Initial Assessment Questions 1. NAME of MEDICINE: "What medicine(s) are you calling about?"     mounjaro  Protocols used: Medication Question Call-A-AH

## 2023-06-03 NOTE — Telephone Encounter (Signed)
She has been on 2mg  ozempic and is switching brands

## 2023-06-03 NOTE — Telephone Encounter (Signed)
Summary: Rx clarification   [facility] with [facility] called for clarification/verification of the Rx for tirzepatide [facility]) 10 MG/0.5ML Pen. Cb# [phone]      Attempted to contact pharmacy [phone] to review medication questions. Unable to contact pharmacy staff . Did not leave message to call back.

## 2023-06-04 NOTE — Telephone Encounter (Signed)
[facility]oke [facility] technician and made them aware of Dr Henriette Combs recommendations for the patient. Pharmacy technician verbalized understanding and has no further questions at this time.

## 2023-06-11 ENCOUNTER — Ambulatory Visit: Payer: BC Managed Care – PPO | Admitting: Family Medicine

## 2023-06-13 ENCOUNTER — Encounter: Payer: Self-pay | Admitting: Family Medicine

## 2023-06-13 ENCOUNTER — Ambulatory Visit: Payer: BC Managed Care – PPO | Admitting: Family Medicine

## 2023-06-13 VITALS — BP 124/76 | HR 72 | Ht 63.5 in | Wt 258.4 lb

## 2023-06-13 DIAGNOSIS — E1165 Type 2 diabetes mellitus with hyperglycemia: Secondary | ICD-10-CM

## 2023-06-13 DIAGNOSIS — Z7985 Long-term (current) use of injectable non-insulin antidiabetic drugs: Secondary | ICD-10-CM

## 2023-06-13 NOTE — Assessment & Plan Note (Signed)
Unable to start mounjaro due to insurance issues. Has maintained her ozempic. Due for 1st mounjaro shot on Tuesday. Called over to pharmacy- they will fill Rx. Call with any concerns. Rechecking A1c today. Recheck 3 months.

## 2023-06-13 NOTE — Progress Notes (Signed)
BP 124/76   Pulse 72   Ht 5' 3.5" (1.613 m)   Wt 258 lb 6.4 oz (117.2 kg)   SpO2 98%   BMI 45.06 kg/m    Subjective:    Patient ID: Mary Drake, female    DOB: 03-01-1981, 42 y.o.   MRN: [id number]  HPI: Mary Drake is a 42 y.o. female  Chief Complaint  Patient presents with   Diabetes    Patient says she has not been able to received the other prescription Greggory Keen) for her Diabetes. Patient says she was approved with her insurance and says they were filling the prescriptions because they needed a titration before they would fill the next dose.    Flu Vaccine    Patient was offered and says she will received it at her job this month.    DIABETES- has not been able to get her mounjaro due to insurance issues Hypoglycemic episodes:no Polydipsia/polyuria: no Visual disturbance: no Chest pain: no Paresthesias: no Glucose Monitoring: yes Taking Insulin?: no Blood Pressure Monitoring: not checking Retinal Examination: Not up to Date Foot Exam: Not up to Date Diabetic Education: Completed Pneumovax: Up to Date Influenza:  Will get at work Aspirin: no  Relevant past medical, surgical, family and social history reviewed and updated as indicated. Interim medical history since our last visit reviewed. Allergies and medications reviewed and updated.  Review of Systems  Constitutional: Negative.   Respiratory: Negative.    Cardiovascular: Negative.   Gastrointestinal: Negative.   Musculoskeletal: Negative.   Neurological: Negative.   Psychiatric/Behavioral: Negative.      Per HPI unless specifically indicated above     Objective:    BP 124/76   Pulse 72   Ht 5' 3.5" (1.613 m)   Wt 258 lb 6.4 oz (117.2 kg)   SpO2 98%   BMI 45.06 kg/m   Wt Readings from Last 3 Encounters:  06/13/23 258 lb 6.4 oz (117.2 kg)  03/11/23 251 lb 3.2 oz (113.9 kg)  12/04/22 258 lb (117 kg)    Physical Exam Vitals and nursing note reviewed.  Constitutional:       General: She is not in acute distress.    Appearance: Normal appearance. She is obese. She is not ill-appearing, toxic-appearing or diaphoretic.  HENT:     Head: Normocephalic and atraumatic.     Right Ear: External ear normal.     Left Ear: External ear normal.     Nose: Nose normal.     Mouth/Throat:     Mouth: Mucous membranes are moist.     Pharynx: Oropharynx is clear.  Eyes:     General: No scleral icterus.       Right eye: No discharge.        Left eye: No discharge.     Extraocular Movements: Extraocular movements intact.     Conjunctiva/sclera: Conjunctivae normal.     Pupils: Pupils are equal, round, and reactive to light.  Cardiovascular:     Rate and Rhythm: Normal rate and regular rhythm.     Pulses: Normal pulses.     Heart sounds: Normal heart sounds. No murmur heard.    No friction rub. No gallop.  Pulmonary:     Effort: Pulmonary effort is normal. No respiratory distress.     Breath sounds: Normal breath sounds. No stridor. No wheezing, rhonchi or rales.  Chest:     Chest wall: No tenderness.  Musculoskeletal:        General: Normal  range of motion.     Cervical back: Normal range of motion and neck supple.  Skin:    General: Skin is warm and dry.     Capillary Refill: Capillary refill takes less than 2 seconds.     Coloration: Skin is not jaundiced or pale.     Findings: No bruising, erythema, lesion or rash.  Neurological:     General: No focal deficit present.     Mental Status: She is alert and oriented to person, place, and time. Mental status is at baseline.  Psychiatric:        Mood and Affect: Mood normal.        Behavior: Behavior normal.        Thought Content: Thought content normal.        Judgment: Judgment normal.     Results for orders placed or performed in visit on 03/11/23  Bayer DCA Hb A1c Waived  Result Value Ref Range   HB A1C (BAYER DCA - WAIVED) 7.9 (H) 4.8 - 5.6 %  CBC with Differential/Platelet  Result Value Ref Range   WBC  10.0 3.4 - 10.8 x10E3/uL   RBC 4.65 3.77 - 5.28 x10E6/uL   Hemoglobin 13.6 11.1 - 15.9 g/dL   Hematocrit 01.0 27.2 - 46.6 %   MCV 91 79 - 97 fL   MCH 29.2 26.6 - 33.0 pg   MCHC 32.3 31.5 - 35.7 g/dL   RDW 53.6 64.4 - 03.4 %   Platelets 384 150 - 450 x10E3/uL   Neutrophils 58 Not Estab. %   Lymphs 32 Not Estab. %   Monocytes 6 Not Estab. %   Eos 3 Not Estab. %   Basos 1 Not Estab. %   Neutrophils Absolute 5.8 1.4 - 7.0 x10E3/uL   Lymphocytes Absolute 3.2 (H) 0.7 - 3.1 x10E3/uL   Monocytes Absolute 0.6 0.1 - 0.9 x10E3/uL   EOS (ABSOLUTE) 0.3 0.0 - 0.4 x10E3/uL   Basophils Absolute 0.1 0.0 - 0.2 x10E3/uL   Immature Granulocytes 0 Not Estab. %   Immature Grans (Abs) 0.0 0.0 - 0.1 x10E3/uL  Comprehensive metabolic panel  Result Value Ref Range   Glucose 188 (H) 70 - 99 mg/dL   BUN 16 6 - 24 mg/dL   Creatinine, Ser 7.42 (H) 0.57 - 1.00 mg/dL   eGFR 71 >59 DG/LOV/5.64   BUN/Creatinine Ratio 16 9 - 23   Sodium 139 134 - 144 mmol/L   Potassium 4.3 3.5 - 5.2 mmol/L   Chloride 99 96 - 106 mmol/L   CO2 23 20 - 29 mmol/L   Calcium 9.4 8.7 - 10.2 mg/dL   Total Protein 7.6 6.0 - 8.5 g/dL   Albumin 4.3 3.9 - 4.9 g/dL   Globulin, Total 3.3 1.5 - 4.5 g/dL   Bilirubin Total 0.3 0.0 - 1.2 mg/dL   Alkaline Phosphatase 58 44 - 121 IU/L   AST 22 0 - 40 IU/L   ALT 30 0 - 32 IU/L  Lipid Panel w/o Chol/HDL Ratio  Result Value Ref Range   Cholesterol, Total 160 100 - 199 mg/dL   Triglycerides 332 (H) 0 - 149 mg/dL   HDL 36 (L) >95 mg/dL   VLDL Cholesterol Cal 34 5 - 40 mg/dL   LDL Chol Calc (NIH) 90 0 - 99 mg/dL  Luteinizing hormone  Result Value Ref Range   LH 22.8 mIU/mL  Follicle stimulating hormone  Result Value Ref Range   FSH 32.2 mIU/mL  Estradiol  Result Value Ref  Range   Estradiol 7.7 pg/mL      Assessment & Plan:   Problem List Items Addressed This Visit       Endocrine   Uncontrolled type 2 diabetes mellitus with hyperglycemia (HCC) - Primary    Unable to start  mounjaro due to insurance issues. Has maintained her ozempic. Due for 1st mounjaro shot on Tuesday. Called over to pharmacy- they will fill Rx. Call with any concerns. Rechecking A1c today. Recheck 3 months.       Relevant Orders   Hgb A1c w/o eAG     Follow up plan: Return in about 3 months (around 09/13/2023) for physical.

## 2023-06-14 LAB — HGB A1C W/O EAG: Hgb A1c MFr Bld: 8.6 % — ABNORMAL HIGH (ref 4.8–5.6)

## 2023-09-12 ENCOUNTER — Ambulatory Visit: Payer: 59 | Admitting: Family Medicine

## 2023-09-12 ENCOUNTER — Encounter: Payer: Self-pay | Admitting: Family Medicine

## 2023-09-12 VITALS — BP 126/84 | HR 78 | Temp 97.6°F | Wt 259.0 lb

## 2023-09-12 DIAGNOSIS — E1165 Type 2 diabetes mellitus with hyperglycemia: Secondary | ICD-10-CM | POA: Diagnosis not present

## 2023-09-12 DIAGNOSIS — Z7985 Long-term (current) use of injectable non-insulin antidiabetic drugs: Secondary | ICD-10-CM

## 2023-09-12 DIAGNOSIS — Z1231 Encounter for screening mammogram for malignant neoplasm of breast: Secondary | ICD-10-CM | POA: Diagnosis not present

## 2023-09-12 DIAGNOSIS — Z0000 Encounter for general adult medical examination without abnormal findings: Secondary | ICD-10-CM

## 2023-09-12 DIAGNOSIS — G43109 Migraine with aura, not intractable, without status migrainosus: Secondary | ICD-10-CM | POA: Diagnosis not present

## 2023-09-12 LAB — MICROALBUMIN, URINE WAIVED
Creatinine, Urine Waived: 200 mg/dL (ref 10–300)
Microalb, Ur Waived: 80 mg/L — ABNORMAL HIGH (ref 0–19)

## 2023-09-12 LAB — BAYER DCA HB A1C WAIVED: HB A1C (BAYER DCA - WAIVED): 7.5 % — ABNORMAL HIGH (ref 4.8–5.6)

## 2023-09-12 MED ORDER — MELOXICAM 15 MG PO TABS: 15.0000 mg | ORAL_TABLET | Freq: Every day | ORAL | 1 refills | Status: DC

## 2023-09-12 MED ORDER — TIRZEPATIDE 12.5 MG/0.5ML ~~LOC~~ SOAJ: 12.5000 mg | SUBCUTANEOUS | 1 refills | Status: DC

## 2023-09-12 NOTE — Patient Instructions (Signed)
Please call to schedule your mammogram and/or bone density: [facility]  Address: [address], [place], [place] [address] Phone: [phone]  [facility] [place] [address] [place],  [place]  [address] Phone: [phone]   

## 2023-09-12 NOTE — Progress Notes (Signed)
BP 126/84   Pulse 78   Temp 97.6 F (36.4 C) (Oral)   Wt 259 lb (117.5 kg)   SpO2 97%   BMI 45.16 kg/m    Subjective:    Patient ID: Mary Drake, female    DOB: 1981-08-15, 43 y.o.   MRN: [id number]  HPI: Mary Drake is a 43 y.o. female presenting on 09/12/2023 for comprehensive medical examination. Current medical complaints include:  DIABETES Hypoglycemic episodes:no Polydipsia/polyuria: no Visual disturbance: no Chest pain: no Paresthesias: no Glucose Monitoring: yes Taking Insulin?: no Blood Pressure Monitoring: rarely Retinal Examination: Up to Date Foot Exam: Up to Date Diabetic Education: Completed Pneumovax: Up to Date Influenza: Up to Date Aspirin: no  HYPERTENSION / HYPERLIPIDEMIA Satisfied with current treatment? yes Duration of hypertension: chronic BP monitoring frequency: not checking BP medication side effects: no Past BP meds: none Duration of hyperlipidemia: chronic Cholesterol medication side effects: no Cholesterol supplements: none Past cholesterol medications: none Medication compliance: N/A Aspirin: no Recent stressors: no Recurrent headaches: no Visual changes: no Palpitations: no Dyspnea: no Chest pain: no Lower extremity edema: no Dizzy/lightheaded: no  Menopausal Symptoms: no  Depression Screen done today and results listed below:     09/12/2023   11:25 AM 06/13/2023   11:36 AM 03/11/2023    3:07 PM 12/04/2022   11:13 AM 09/04/2022    4:07 PM  Depression screen PHQ 2/9  Decreased Interest 1 0 0 0 0  Down, Depressed, Hopeless 0 1 0 1 1  PHQ - 2 Score 1 1 0 1 1  Altered sleeping 0 0 0 0 0  Tired, decreased energy 1 1 1 1 1   Change in appetite 0 0 1 0 1  Feeling bad or failure about yourself  0 1 1 1 1   Trouble concentrating 0 0 0 0 0  Moving slowly or fidgety/restless 0 0 0 0 0  Suicidal thoughts 0 0 1 1 0  PHQ-9 Score 2 3 4 4 4   Difficult doing work/chores Somewhat difficult Somewhat difficult Not  difficult at all Somewhat difficult Not difficult at all    Past Medical History:  Past Medical History:  Diagnosis Date   Migraines    Psoriasis     Surgical History:  Past Surgical History:  Procedure Laterality Date   CHOLECYSTECTOMY  2010    Medications:  Current Outpatient Medications on File Prior to Visit  Medication Sig   azelastine (ASTELIN) 0.1 % nasal spray Place 2 sprays into both nostrils 2 (two) times daily. Use in each nostril as directed   cetirizine (ZYRTEC) 10 MG tablet Take 10 mg by mouth daily.   nystatin (MYCOSTATIN/NYSTOP) powder Apply 1 Application topically 3 (three) times daily.   Rimegepant Sulfate (NURTEC) 75 MG TBDP Take 1 tablet (75 mg total) by mouth daily as needed (migraine).   TALTZ 80 MG/ML SOAJ    No current facility-administered medications on file prior to visit.    Allergies:  Allergies  Allergen Reactions   Codeine     Social History:  Social History   Socioeconomic History   Marital status: Single    Spouse name: Not on file   Number of children: Not on file   Years of education: Not on file   Highest education level: Not on file  Occupational History   Not on file  Tobacco Use   Smoking status: Never   Smokeless tobacco: Never  Vaping Use   Vaping status: Never Used  Substance and Sexual  Activity   Alcohol use: Yes    Comment: 0n occasion   Drug use: No   Sexual activity: Yes    Birth control/protection: None  Other Topics Concern   Not on file  Social History Narrative   Not on file   Social Drivers of Health   Financial Resource Strain: Not on file  Food Insecurity: Not on file  Transportation Needs: Not on file  Physical Activity: Not on file  Stress: Not on file  Social Connections: Not on file  Intimate Partner Violence: Not on file   Social History   Tobacco Use  Smoking Status Never  Smokeless Tobacco Never   Social History   Substance and Sexual Activity  Alcohol Use Yes   Comment: 0n  occasion    Family History:  Family History  Problem Relation Age of Onset   Diabetes Mother    Hypertension Mother    Diabetes Father    Heart disease Father    Heart failure Father     Past medical history, surgical history, medications, allergies, family history and social history reviewed with patient today and changes made to appropriate areas of the chart.   Review of Systems  Constitutional: Negative.   HENT:  Positive for congestion. Negative for ear discharge, ear pain, hearing loss, nosebleeds, sinus pain, sore throat and tinnitus.   Eyes: Negative.   Respiratory: Negative.  Negative for stridor.   Cardiovascular: Negative.   Gastrointestinal:  Positive for nausea. Negative for abdominal pain, blood in stool, constipation, diarrhea, heartburn, melena and vomiting.  Genitourinary: Negative.   Musculoskeletal: Negative.   Skin: Negative.        Has been having some pimples pop up on her legs mainly  Neurological:  Positive for headaches. Negative for dizziness, tingling, tremors, sensory change, speech change, focal weakness, seizures, loss of consciousness and weakness.  Endo/Heme/Allergies: Negative.    All other ROS negative except what is listed above and in the HPI.      Objective:    BP 126/84   Pulse 78   Temp 97.6 F (36.4 C) (Oral)   Wt 259 lb (117.5 kg)   SpO2 97%   BMI 45.16 kg/m   Wt Readings from Last 3 Encounters:  09/12/23 259 lb (117.5 kg)  06/13/23 258 lb 6.4 oz (117.2 kg)  03/11/23 251 lb 3.2 oz (113.9 kg)    Physical Exam Vitals and nursing note reviewed.  Constitutional:      General: She is not in acute distress.    Appearance: Normal appearance. She is obese. She is not ill-appearing, toxic-appearing or diaphoretic.  HENT:     Head: Normocephalic and atraumatic.     Right Ear: Tympanic membrane, ear canal and external ear normal. There is no impacted cerumen.     Left Ear: Tympanic membrane, ear canal and external ear normal. There  is no impacted cerumen.     Nose: Nose normal. No congestion or rhinorrhea.     Mouth/Throat:     Mouth: Mucous membranes are moist.     Pharynx: Oropharynx is clear. No oropharyngeal exudate or posterior oropharyngeal erythema.  Eyes:     General: No scleral icterus.       Right eye: No discharge.        Left eye: No discharge.     Extraocular Movements: Extraocular movements intact.     Conjunctiva/sclera: Conjunctivae normal.     Pupils: Pupils are equal, round, and reactive to light.  Neck:  Vascular: No carotid bruit.  Cardiovascular:     Rate and Rhythm: Normal rate and regular rhythm.     Pulses: Normal pulses.     Heart sounds: No murmur heard.    No friction rub. No gallop.  Pulmonary:     Effort: Pulmonary effort is normal. No respiratory distress.     Breath sounds: Normal breath sounds. No stridor. No wheezing, rhonchi or rales.  Chest:     Chest wall: No tenderness.  Abdominal:     General: Abdomen is flat. Bowel sounds are normal. There is no distension.     Palpations: Abdomen is soft. There is no mass.     Tenderness: There is no abdominal tenderness. There is no right CVA tenderness, left CVA tenderness, guarding or rebound.     Hernia: No hernia is present.  Genitourinary:    Comments: Breast and pelvic exams deferred with shared decision making Musculoskeletal:        General: No swelling, tenderness, deformity or signs of injury.     Cervical back: Normal range of motion and neck supple. No rigidity. No muscular tenderness.     Right lower leg: No edema.     Left lower leg: No edema.  Lymphadenopathy:     Cervical: No cervical adenopathy.  Skin:    General: Skin is warm and dry.     Capillary Refill: Capillary refill takes less than 2 seconds.     Coloration: Skin is not jaundiced or pale.     Findings: No bruising, erythema, lesion or rash.  Neurological:     General: No focal deficit present.     Mental Status: She is alert and oriented to person,  place, and time. Mental status is at baseline.     Cranial Nerves: No cranial nerve deficit.     Sensory: No sensory deficit.     Motor: No weakness.     Coordination: Coordination normal.     Gait: Gait normal.     Deep Tendon Reflexes: Reflexes normal.  Psychiatric:        Mood and Affect: Mood normal.        Behavior: Behavior normal.        Thought Content: Thought content normal.        Judgment: Judgment normal.     Results for orders placed or performed in visit on 06/13/23  Hgb A1c w/o eAG   Collection Time: 06/13/23 11:56 AM  Result Value Ref Range   Hgb A1c MFr Bld 8.6 (H) 4.8 - 5.6 %      Assessment & Plan:   Problem List Items Addressed This Visit       Cardiovascular and Mediastinum   Migraines   Under good control on current regimen. Continue current regimen. Continue to monitor. Call with any concerns. Refills given.        Relevant Medications   meloxicam (MOBIC) 15 MG tablet     Endocrine   Uncontrolled type 2 diabetes mellitus with hyperglycemia (HCC)   Improved with A1c of 7.5 down from 8.3. We will increase her mounjaro to 12.5mg  and recheck in 3 months. Call with any concerns.       Relevant Medications   tirzepatide (MOUNJARO) 12.5 MG/0.5ML Pen   Other Relevant Orders   Bayer DCA Hb A1c Waived   Comprehensive metabolic panel   Lipid Panel w/o Chol/HDL Ratio   Microalbumin, Urine Waived     Other   Obesity, morbid (HCC)   Encouraged diet  and exercise with goal of losing 1-2lbs per week.       Relevant Medications   tirzepatide (MOUNJARO) 12.5 MG/0.5ML Pen   Other Visit Diagnoses       Routine general medical examination at a health care facility    -  Primary   Vaccines up to date/declined. Screening labs checked today. Pap up to date Mammo due in May. Continue diet and exercise. Call with any concerns.   Relevant Orders   Bayer DCA Hb A1c Waived   CBC with Differential/Platelet   Comprehensive metabolic panel   Lipid Panel w/o  Chol/HDL Ratio   TSH   Microalbumin, Urine Waived     Encounter for screening mammogram for malignant neoplasm of breast       Mammogram ordered today.   Relevant Orders   MM 3D SCREENING MAMMOGRAM BILATERAL BREAST        Follow up plan: Return in about 3 months (around 12/11/2023).   LABORATORY TESTING:  - Pap smear: up to date  IMMUNIZATIONS:   - Tdap: Tetanus vaccination status reviewed: last tetanus booster within 10 years. - Influenza: Refused - Pneumovax: Up to date - Prevnar: Refused - COVID: Refused - HPV: Refused - Shingrix vaccine: Not applicable  SCREENING: -Mammogram: Ordered today   PATIENT COUNSELING:   Advised to take 1 mg of folate supplement per day if capable of pregnancy.   Sexuality: Discussed sexually transmitted diseases, partner selection, use of condoms, avoidance of unintended pregnancy  and contraceptive alternatives.   Advised to avoid cigarette smoking.  I discussed with the patient that most people either abstain from alcohol or drink within safe limits (<=14/week and <=4 drinks/occasion for males, <=7/weeks and <= 3 drinks/occasion for females) and that the risk for alcohol disorders and other health effects rises proportionally with the number of drinks per week and how often a drinker exceeds daily limits.  Discussed cessation/primary prevention of drug use and availability of treatment for abuse.   Diet: Encouraged to adjust caloric intake to maintain  or achieve ideal body weight, to reduce intake of dietary saturated fat and total fat, to limit sodium intake by avoiding high sodium foods and not adding table salt, and to maintain adequate dietary potassium and calcium preferably from fresh fruits, vegetables, and low-fat dairy products.    stressed the importance of regular exercise  Injury prevention: Discussed safety belts, safety helmets, smoke detector, smoking near bedding or upholstery.   Dental health: Discussed importance of  regular tooth brushing, flossing, and dental visits.    NEXT PREVENTATIVE PHYSICAL DUE IN 1 YEAR. Return in about 3 months (around 12/11/2023).

## 2023-09-12 NOTE — Assessment & Plan Note (Signed)
Under good control on current regimen. Continue current regimen. Continue to monitor. Call with any concerns. Refills given.   

## 2023-09-12 NOTE — Assessment & Plan Note (Signed)
Improved with A1c of 7.5 down from 8.3. We will increase her mounjaro to 12.5mg  and recheck in 3 months. Call with any concerns.

## 2023-09-12 NOTE — Assessment & Plan Note (Signed)
Encouraged diet and exercise with goal of losing 1-2lbs per week.  

## 2023-09-13 LAB — CBC WITH DIFFERENTIAL/PLATELET
Basophils Absolute: 0.1 10*3/uL (ref 0.0–0.2)
Basos: 1 %
EOS (ABSOLUTE): 0.3 10*3/uL (ref 0.0–0.4)
Eos: 3 %
Hematocrit: 43 % (ref 34.0–46.6)
Hemoglobin: 14.2 g/dL (ref 11.1–15.9)
Immature Grans (Abs): 0 10*3/uL (ref 0.0–0.1)
Immature Granulocytes: 0 %
Lymphocytes Absolute: 2.5 10*3/uL (ref 0.7–3.1)
Lymphs: 29 %
MCH: 29.7 pg (ref 26.6–33.0)
MCHC: 33 g/dL (ref 31.5–35.7)
MCV: 90 fL (ref 79–97)
Monocytes Absolute: 0.7 10*3/uL (ref 0.1–0.9)
Monocytes: 8 %
Neutrophils Absolute: 5.3 10*3/uL (ref 1.4–7.0)
Neutrophils: 59 %
Platelets: 372 10*3/uL (ref 150–450)
RBC: 4.78 x10E6/uL (ref 3.77–5.28)
RDW: 13 % (ref 11.7–15.4)
WBC: 8.9 10*3/uL (ref 3.4–10.8)

## 2023-09-13 LAB — COMPREHENSIVE METABOLIC PANEL
ALT: 26 [IU]/L (ref 0–32)
AST: 17 [IU]/L (ref 0–40)
Albumin: 4.1 g/dL (ref 3.9–4.9)
Alkaline Phosphatase: 53 [IU]/L (ref 44–121)
BUN/Creatinine Ratio: 14 (ref 9–23)
BUN: 11 mg/dL (ref 6–24)
Bilirubin Total: 0.4 mg/dL (ref 0.0–1.2)
CO2: 19 mmol/L — ABNORMAL LOW (ref 20–29)
Calcium: 9.1 mg/dL (ref 8.7–10.2)
Chloride: 97 mmol/L (ref 96–106)
Creatinine, Ser: 0.81 mg/dL (ref 0.57–1.00)
Globulin, Total: 3.2 g/dL (ref 1.5–4.5)
Glucose: 194 mg/dL — ABNORMAL HIGH (ref 70–99)
Potassium: 4.3 mmol/L (ref 3.5–5.2)
Sodium: 138 mmol/L (ref 134–144)
Total Protein: 7.3 g/dL (ref 6.0–8.5)
eGFR: 93 mL/min/{1.73_m2} (ref >59)

## 2023-09-13 LAB — LIPID PANEL W/O CHOL/HDL RATIO
Cholesterol, Total: 167 mg/dL (ref 100–199)
HDL: 34 mg/dL — ABNORMAL LOW (ref >39)
LDL Chol Calc (NIH): 99 mg/dL (ref 0–99)
Triglycerides: 193 mg/dL — ABNORMAL HIGH (ref 0–149)
VLDL Cholesterol Cal: 34 mg/dL (ref 5–40)

## 2023-09-13 LAB — TSH: TSH: 2.34 u[IU]/mL (ref 0.450–4.500)

## 2023-09-15 ENCOUNTER — Encounter: Payer: Self-pay | Admitting: Family Medicine

## 2023-09-20 ENCOUNTER — Encounter: Payer: Self-pay | Admitting: Family Medicine

## 2023-09-22 NOTE — Telephone Encounter (Signed)
Yes. Please get her to fill out the [facility] paperwork.

## 2023-11-26 ENCOUNTER — Ambulatory Visit: Payer: Self-pay | Admitting: Nurse Practitioner

## 2023-11-26 ENCOUNTER — Encounter: Payer: Self-pay | Admitting: Nurse Practitioner

## 2023-11-26 VITALS — BP 117/76 | HR 73 | Temp 98.6°F | Resp 16 | Ht 63.5 in | Wt 255.4 lb

## 2023-11-26 DIAGNOSIS — H6992 Unspecified Eustachian tube disorder, left ear: Secondary | ICD-10-CM

## 2023-11-26 MED ORDER — MONTELUKAST SODIUM 10 MG PO TABS: 10.0000 mg | ORAL_TABLET | Freq: Every day | ORAL | 0 refills | Status: DC

## 2023-11-26 NOTE — Assessment & Plan Note (Signed)
 On Zyrtec and flonase daily.  Will treat with Singulair daily for 30 days.  Side effects and benefits discussed.  Discussed black box warning and symptoms to  monitor for.  If symptoms not improved in 2 weeks can refer to ENT.

## 2023-11-26 NOTE — Progress Notes (Signed)
 BP 117/76 (BP Location: Right Arm, Patient Position: Sitting, Cuff Size: Large)   Pulse 73   Temp 98.6 F (37 C) (Oral)   Resp 16   Ht 5' 3.5" (1.613 m)   Wt 255 lb 6.4 oz (115.8 kg)   LMP 10/23/2023 (Approximate)   SpO2 98%   BMI 44.53 kg/m    Subjective:    Patient ID: Mary Drake, female    DOB: 05/26/1981, 43 y.o.   MRN: [id number]  HPI: Mary Drake is a 43 y.o. female  Chief Complaint  Patient presents with   Ear Pain    Can feel and hear fluid. Comes and goes. Started last week. Hearing is ok. Right ear. Can feel like it is numb or asleep.    EAR PAIN In the last two days she has a little bit of pain twice for a short min then goes away Duration:  started last week Involved ear(s): right Severity:  mild  Quality:  sharp Fever: no Otorrhea: no Upper respiratory infection symptoms: yes Pruritus: yes Hearing loss: no Water immersion no Using Q-tips: yes Recurrent otitis media: no Status: stable Treatments attempted: none  Relevant past medical, surgical, family and social history reviewed and updated as indicated. Interim medical history since our last visit reviewed. Allergies and medications reviewed and updated.  Review of Systems  HENT:  Positive for congestion and ear pain.     Per HPI unless specifically indicated above     Objective:    BP 117/76 (BP Location: Right Arm, Patient Position: Sitting, Cuff Size: Large)   Pulse 73   Temp 98.6 F (37 C) (Oral)   Resp 16   Ht 5' 3.5" (1.613 m)   Wt 255 lb 6.4 oz (115.8 kg)   LMP 10/23/2023 (Approximate)   SpO2 98%   BMI 44.53 kg/m   Wt Readings from Last 3 Encounters:  11/26/23 255 lb 6.4 oz (115.8 kg)  09/12/23 259 lb (117.5 kg)  06/13/23 258 lb 6.4 oz (117.2 kg)    Physical Exam Vitals and nursing note reviewed.  Constitutional:      General: She is not in acute distress.    Appearance: Normal appearance. She is normal weight. She is not ill-appearing, toxic-appearing  or diaphoretic.  HENT:     Head: Normocephalic.     Right Ear: External ear normal. A middle ear effusion is present.     Left Ear: External ear normal. A middle ear effusion is present.     Nose: Rhinorrhea present.     Mouth/Throat:     Mouth: Mucous membranes are moist.     Pharynx: Oropharynx is clear.  Eyes:     General:        Right eye: No discharge.        Left eye: No discharge.     Extraocular Movements: Extraocular movements intact.     Conjunctiva/sclera: Conjunctivae normal.     Pupils: Pupils are equal, round, and reactive to light.  Cardiovascular:     Rate and Rhythm: Normal rate and regular rhythm.     Heart sounds: No murmur heard. Pulmonary:     Effort: Pulmonary effort is normal. No respiratory distress.     Breath sounds: Normal breath sounds. No wheezing or rales.  Musculoskeletal:     Cervical back: Normal range of motion and neck supple.  Skin:    General: Skin is warm and dry.     Capillary Refill: Capillary refill takes less than  2 seconds.  Neurological:     General: No focal deficit present.     Mental Status: She is alert and oriented to person, place, and time. Mental status is at baseline.  Psychiatric:        Mood and Affect: Mood normal.        Behavior: Behavior normal.        Thought Content: Thought content normal.        Judgment: Judgment normal.     Results for orders placed or performed in visit on 09/12/23  Bayer DCA Hb A1c Waived   Collection Time: 09/12/23 11:25 AM  Result Value Ref Range   HB A1C (BAYER DCA - WAIVED) 7.5 (H) 4.8 - 5.6 %  Microalbumin, Urine Waived   Collection Time: 09/12/23 11:25 AM  Result Value Ref Range   Microalb, Ur Waived 80 (H) 0 - 19 mg/L   Creatinine, Urine Waived 200 10 - 300 mg/dL   Microalb/Creat Ratio 30-300 (H) <30 mg/g  CBC with Differential/Platelet   Collection Time: 09/12/23 11:26 AM  Result Value Ref Range   WBC 8.9 3.4 - 10.8 x10E3/uL   RBC 4.78 3.77 - 5.28 x10E6/uL   Hemoglobin  14.2 11.1 - 15.9 g/dL   Hematocrit 78.2 95.6 - 46.6 %   MCV 90 79 - 97 fL   MCH 29.7 26.6 - 33.0 pg   MCHC 33.0 31.5 - 35.7 g/dL   RDW 21.3 08.6 - 57.8 %   Platelets 372 150 - 450 x10E3/uL   Neutrophils 59 Not Estab. %   Lymphs 29 Not Estab. %   Monocytes 8 Not Estab. %   Eos 3 Not Estab. %   Basos 1 Not Estab. %   Neutrophils Absolute 5.3 1.4 - 7.0 x10E3/uL   Lymphocytes Absolute 2.5 0.7 - 3.1 x10E3/uL   Monocytes Absolute 0.7 0.1 - 0.9 x10E3/uL   EOS (ABSOLUTE) 0.3 0.0 - 0.4 x10E3/uL   Basophils Absolute 0.1 0.0 - 0.2 x10E3/uL   Immature Granulocytes 0 Not Estab. %   Immature Grans (Abs) 0.0 0.0 - 0.1 x10E3/uL  Comprehensive metabolic panel   Collection Time: 09/12/23 11:26 AM  Result Value Ref Range   Glucose 194 (H) 70 - 99 mg/dL   BUN 11 6 - 24 mg/dL   Creatinine, Ser 4.69 0.57 - 1.00 mg/dL   eGFR 93 >62 XB/MWU/1.32   BUN/Creatinine Ratio 14 9 - 23   Sodium 138 134 - 144 mmol/L   Potassium 4.3 3.5 - 5.2 mmol/L   Chloride 97 96 - 106 mmol/L   CO2 19 (L) 20 - 29 mmol/L   Calcium 9.1 8.7 - 10.2 mg/dL   Total Protein 7.3 6.0 - 8.5 g/dL   Albumin 4.1 3.9 - 4.9 g/dL   Globulin, Total 3.2 1.5 - 4.5 g/dL   Bilirubin Total 0.4 0.0 - 1.2 mg/dL   Alkaline Phosphatase 53 44 - 121 IU/L   AST 17 0 - 40 IU/L   ALT 26 0 - 32 IU/L  Lipid Panel w/o Chol/HDL Ratio   Collection Time: 09/12/23 11:26 AM  Result Value Ref Range   Cholesterol, Total 167 100 - 199 mg/dL   Triglycerides 440 (H) 0 - 149 mg/dL   HDL 34 (L) >10 mg/dL   VLDL Cholesterol Cal 34 5 - 40 mg/dL   LDL Chol Calc (NIH) 99 0 - 99 mg/dL  TSH   Collection Time: 09/12/23 11:26 AM  Result Value Ref Range   TSH 2.340 0.450 - 4.500  uIU/mL      Assessment & Plan:   Problem List Items Addressed This Visit       Nervous and Auditory   Eustachian tube dysfunction, left - Primary   On Zyrtec and flonase daily.  Will treat with Singulair daily for 30 days.  Side effects and benefits discussed.  Discussed black box  warning and symptoms to  monitor for.  If symptoms not improved in 2 weeks can refer to ENT.          Follow up plan: No follow-ups on file.

## 2023-12-17 ENCOUNTER — Encounter: Payer: Self-pay | Admitting: Family Medicine

## 2023-12-17 ENCOUNTER — Ambulatory Visit: Payer: Self-pay | Admitting: Family Medicine

## 2023-12-17 VITALS — BP 142/89 | HR 74 | Ht 63.5 in | Wt 257.1 lb

## 2023-12-17 DIAGNOSIS — H6992 Unspecified Eustachian tube disorder, left ear: Secondary | ICD-10-CM | POA: Diagnosis not present

## 2023-12-17 DIAGNOSIS — G43109 Migraine with aura, not intractable, without status migrainosus: Secondary | ICD-10-CM

## 2023-12-17 DIAGNOSIS — E1165 Type 2 diabetes mellitus with hyperglycemia: Secondary | ICD-10-CM | POA: Diagnosis not present

## 2023-12-17 LAB — BAYER DCA HB A1C WAIVED: HB A1C (BAYER DCA - WAIVED): 7.7 % — ABNORMAL HIGH (ref 4.8–5.6)

## 2023-12-17 MED ORDER — PREDNISONE 50 MG PO TABS: 50.0000 mg | ORAL_TABLET | Freq: Every day | ORAL | 0 refills | Status: DC

## 2023-12-17 MED ORDER — MONTELUKAST SODIUM 10 MG PO TABS: 10.0000 mg | ORAL_TABLET | Freq: Every day | ORAL | 1 refills | Status: DC

## 2023-12-17 NOTE — Assessment & Plan Note (Signed)
 Up slightly with A1c of 7.7. Will continue current regimen. Continue to monitor. Call with any concerns. Recheck 3 months.

## 2023-12-17 NOTE — Progress Notes (Signed)
 BP (!) 142/89 (BP Location: Left Arm, Patient Position: Sitting, Cuff Size: Large)   Pulse 74   Ht 5' 3.5" (1.613 m)   Wt 257 lb 1.6 oz (116.6 kg)   LMP 10/23/2023 (Approximate)   SpO2 99%   BMI 44.83 kg/m    Subjective:    Patient ID: Mary Drake, female    DOB: 12-26-1980, 43 y.o.   MRN: [id number]  HPI: Mary Drake is a 43 y.o. female  Chief Complaint  Patient presents with   Diabetes   DIABETES- has been having more of a bubbly stomach with the mounjaro . Seems like it's an issue right around when she takes her shot  Hypoglycemic episodes:no Polydipsia/polyuria: no Visual disturbance: no Chest pain: no Paresthesias: no Glucose Monitoring: yes  Accucheck frequency: occasionally Taking Insulin?: no Blood Pressure Monitoring: not checking Retinal Examination: Not up to Date Foot Exam: Up to Date Diabetic Education: Completed Pneumovax: Up to Date Influenza: Up to Date Aspirin: no  EAG CLOGGED Duration: weeks, was better with singulair  until last night again Involved ear(s):  left Sensation of feeling clogged/plugged: yes Decreased/muffled hearing:yes Ear pain: no Fever: no Otorrhea: no Hearing loss: yes Upper respiratory infection symptoms: no Using Q-Tips: no Status: worse History of cerumenosis: no Treatments attempted:  singulair , zyrtec   Relevant past medical, surgical, family and social history reviewed and updated as indicated. Interim medical history since our last visit reviewed. Allergies and medications reviewed and updated.  Review of Systems  Constitutional: Negative.   HENT:  Positive for ear pain. Negative for congestion, dental problem, drooling, ear discharge, facial swelling, hearing loss, mouth sores, nosebleeds, postnasal drip, rhinorrhea, sinus pressure, sinus pain, sneezing, sore throat, tinnitus, trouble swallowing and voice change.   Respiratory: Negative.    Cardiovascular: Negative.     Per HPI unless  specifically indicated above     Objective:    BP (!) 142/89 (BP Location: Left Arm, Patient Position: Sitting, Cuff Size: Large)   Pulse 74   Ht 5' 3.5" (1.613 m)   Wt 257 lb 1.6 oz (116.6 kg)   LMP 10/23/2023 (Approximate)   SpO2 99%   BMI 44.83 kg/m   Wt Readings from Last 3 Encounters:  12/17/23 257 lb 1.6 oz (116.6 kg)  11/26/23 255 lb 6.4 oz (115.8 kg)  09/12/23 259 lb (117.5 kg)    Physical Exam Vitals and nursing note reviewed.  Constitutional:      General: She is not in acute distress.    Appearance: Normal appearance. She is obese. She is not ill-appearing, toxic-appearing or diaphoretic.  HENT:     Head: Normocephalic and atraumatic.     Right Ear: External ear normal.     Left Ear: External ear normal.     Nose: Nose normal.     Mouth/Throat:     Mouth: Mucous membranes are moist.     Pharynx: Oropharynx is clear.  Eyes:     General: No scleral icterus.       Right eye: No discharge.        Left eye: No discharge.     Extraocular Movements: Extraocular movements intact.     Conjunctiva/sclera: Conjunctivae normal.     Pupils: Pupils are equal, round, and reactive to light.  Cardiovascular:     Rate and Rhythm: Normal rate and regular rhythm.     Pulses: Normal pulses.     Heart sounds: Normal heart sounds. No murmur heard.    No friction rub. No  gallop.  Pulmonary:     Effort: Pulmonary effort is normal. No respiratory distress.     Breath sounds: Normal breath sounds. No stridor. No wheezing, rhonchi or rales.  Chest:     Chest wall: No tenderness.  Musculoskeletal:        General: Normal range of motion.     Cervical back: Normal range of motion and neck supple.  Skin:    General: Skin is warm and dry.     Capillary Refill: Capillary refill takes less than 2 seconds.     Coloration: Skin is not jaundiced or pale.     Findings: No bruising, erythema, lesion or rash.  Neurological:     General: No focal deficit present.     Mental Status: She is  alert and oriented to person, place, and time. Mental status is at baseline.  Psychiatric:        Mood and Affect: Mood normal.        Behavior: Behavior normal.        Thought Content: Thought content normal.        Judgment: Judgment normal.     Results for orders placed or performed in visit on 09/12/23  Bayer DCA Hb A1c Waived   Collection Time: 09/12/23 11:25 AM  Result Value Ref Range   HB A1C (BAYER DCA - WAIVED) 7.5 (H) 4.8 - 5.6 %  Microalbumin, Urine Waived   Collection Time: 09/12/23 11:25 AM  Result Value Ref Range   Microalb, Ur Waived 80 (H) 0 - 19 mg/L   Creatinine, Urine Waived 200 10 - 300 mg/dL   Microalb/Creat Ratio 30-300 (H) <30 mg/g  CBC with Differential/Platelet   Collection Time: 09/12/23 11:26 AM  Result Value Ref Range   WBC 8.9 3.4 - 10.8 x10E3/uL   RBC 4.78 3.77 - 5.28 x10E6/uL   Hemoglobin 14.2 11.1 - 15.9 g/dL   Hematocrit 16.1 09.6 - 46.6 %   MCV 90 79 - 97 fL   MCH 29.7 26.6 - 33.0 pg   MCHC 33.0 31.5 - 35.7 g/dL   RDW 04.5 40.9 - 81.1 %   Platelets 372 150 - 450 x10E3/uL   Neutrophils 59 Not Estab. %   Lymphs 29 Not Estab. %   Monocytes 8 Not Estab. %   Eos 3 Not Estab. %   Basos 1 Not Estab. %   Neutrophils Absolute 5.3 1.4 - 7.0 x10E3/uL   Lymphocytes Absolute 2.5 0.7 - 3.1 x10E3/uL   Monocytes Absolute 0.7 0.1 - 0.9 x10E3/uL   EOS (ABSOLUTE) 0.3 0.0 - 0.4 x10E3/uL   Basophils Absolute 0.1 0.0 - 0.2 x10E3/uL   Immature Granulocytes 0 Not Estab. %   Immature Grans (Abs) 0.0 0.0 - 0.1 x10E3/uL  Comprehensive metabolic panel   Collection Time: 09/12/23 11:26 AM  Result Value Ref Range   Glucose 194 (H) 70 - 99 mg/dL   BUN 11 6 - 24 mg/dL   Creatinine, Ser 9.14 0.57 - 1.00 mg/dL   eGFR 93 >78 GN/FAO/1.30   BUN/Creatinine Ratio 14 9 - 23   Sodium 138 134 - 144 mmol/L   Potassium 4.3 3.5 - 5.2 mmol/L   Chloride 97 96 - 106 mmol/L   CO2 19 (L) 20 - 29 mmol/L   Calcium 9.1 8.7 - 10.2 mg/dL   Total Protein 7.3 6.0 - 8.5 g/dL    Albumin 4.1 3.9 - 4.9 g/dL   Globulin, Total 3.2 1.5 - 4.5 g/dL   Bilirubin Total 0.4 0.0 -  1.2 mg/dL   Alkaline Phosphatase 53 44 - 121 IU/L   AST 17 0 - 40 IU/L   ALT 26 0 - 32 IU/L  Lipid Panel w/o Chol/HDL Ratio   Collection Time: 09/12/23 11:26 AM  Result Value Ref Range   Cholesterol, Total 167 100 - 199 mg/dL   Triglycerides 161 (H) 0 - 149 mg/dL   HDL 34 (L) >09 mg/dL   VLDL Cholesterol Cal 34 5 - 40 mg/dL   LDL Chol Calc (NIH) 99 0 - 99 mg/dL  TSH   Collection Time: 09/12/23 11:26 AM  Result Value Ref Range   TSH 2.340 0.450 - 4.500 uIU/mL      Assessment & Plan:   Problem List Items Addressed This Visit       Cardiovascular and Mediastinum   Migraines     Endocrine   Uncontrolled type 2 diabetes mellitus with hyperglycemia (HCC) - Primary   Up slightly with A1c of 7.7. Will continue current regimen. Continue to monitor. Call with any concerns. Recheck 3 months.       Relevant Orders   Bayer DCA Hb A1c Waived     Nervous and Auditory   Eustachian tube dysfunction, left   Relevant Orders   Ambulatory referral to ENT     Follow up plan: Return in about 3 months (around 03/17/2024).

## 2024-02-09 ENCOUNTER — Other Ambulatory Visit: Payer: Self-pay | Admitting: Family Medicine

## 2024-02-11 NOTE — Telephone Encounter (Signed)
 Requested Prescriptions  Refused Prescriptions Disp Refills   omeprazole  (PRILOSEC) 20 MG capsule [Pharmacy Med Name: OMEPRAZOLE  DR 20 MG CAPSULE] 90 capsule 3    Sig: TAKE 1 CAPSULE BY MOUTH EVERY DAY     Gastroenterology: Proton Pump Inhibitors Passed - 02/11/2024  2:29 PM      Passed - Valid encounter within last 12 months    Recent Outpatient Visits           1 month ago Uncontrolled type 2 diabetes mellitus with hyperglycemia [facility])   [facility] Earle, Megan P, DO   2 months ago Eustachian tube dysfunction, left   [facility] Aileen Alexanders, NP

## 2024-03-20 ENCOUNTER — Ambulatory Visit: Admitting: Family Medicine

## 2024-03-20 ENCOUNTER — Encounter: Payer: Self-pay | Admitting: Family Medicine

## 2024-03-20 VITALS — BP 136/84 | HR 74 | Temp 97.3°F | Wt 254.6 lb

## 2024-03-20 DIAGNOSIS — E1165 Type 2 diabetes mellitus with hyperglycemia: Secondary | ICD-10-CM | POA: Diagnosis not present

## 2024-03-20 DIAGNOSIS — Z789 Other specified health status: Secondary | ICD-10-CM | POA: Diagnosis not present

## 2024-03-20 DIAGNOSIS — R1013 Epigastric pain: Secondary | ICD-10-CM | POA: Diagnosis not present

## 2024-03-20 LAB — BAYER DCA HB A1C WAIVED: HB A1C (BAYER DCA - WAIVED): 8.2 % — ABNORMAL HIGH (ref 4.8–5.6)

## 2024-03-20 MED ORDER — MONTELUKAST SODIUM 10 MG PO TABS: 10.0000 mg | ORAL_TABLET | Freq: Every day | ORAL | 1 refills | Status: DC

## 2024-03-20 MED ORDER — MELOXICAM 15 MG PO TABS: 15.0000 mg | ORAL_TABLET | Freq: Every day | ORAL | 1 refills | Status: DC

## 2024-03-20 MED ORDER — NURTEC 75 MG PO TBDP: 75.0000 mg | ORAL_TABLET | Freq: Every day | ORAL | 12 refills | Status: AC | PRN

## 2024-03-20 MED ORDER — TIRZEPATIDE 15 MG/0.5ML ~~LOC~~ SOAJ: 15.0000 mg | SUBCUTANEOUS | 1 refills | Status: DC

## 2024-03-20 MED ORDER — SUCRALFATE 1 G PO TABS: 1.0000 g | ORAL_TABLET | Freq: Three times a day (TID) | ORAL | 1 refills | Status: DC

## 2024-03-20 NOTE — Progress Notes (Signed)
 BP 136/84   Pulse 74   Temp (!) 97.3 F (36.3 C) (Oral)   Wt 254 lb 9.6 oz (115.5 kg)   LMP 03/08/2024 (Exact Date)   SpO2 97%   BMI 44.39 kg/m    Subjective:    Patient ID: Mary Drake, female    DOB: [birth year], 43 y.o.   MRN: [id number]  HPI: Mary Drake is a 43 y.o. female  Chief Complaint  Patient presents with   Diabetes   DIABETES Hypoglycemic episodes:no Polydipsia/polyuria: no Visual disturbance: no Chest pain: no Paresthesias: no Glucose Monitoring: yes  Accucheck frequency: occasionally Taking Insulin?: no Blood Pressure Monitoring: not checking Retinal Examination: Not up to Date Foot Exam: Up to Date Diabetic Education: Completed Pneumovax: Up to Date Influenza: Up to Date Aspirin: no  Relevant past medical, surgical, family and social history reviewed and updated as indicated. Interim medical history since our last visit reviewed. Allergies and medications reviewed and updated.  Review of Systems  Constitutional: Negative.   Respiratory: Negative.    Cardiovascular: Negative.   Gastrointestinal:  Positive for abdominal pain (epigastric pain a couple of days before her shot is due). Negative for abdominal distention, anal bleeding, blood in stool, constipation, diarrhea, nausea, rectal pain and vomiting.    Per HPI unless specifically indicated above     Objective:    BP 136/84   Pulse 74   Temp (!) 97.3 F (36.3 C) (Oral)   Wt 254 lb 9.6 oz (115.5 kg)   LMP 03/08/2024 (Exact Date)   SpO2 97%   BMI 44.39 kg/m   Wt Readings from Last 3 Encounters:  03/20/24 254 lb 9.6 oz (115.5 kg)  12/17/23 257 lb 1.6 oz (116.6 kg)  11/26/23 255 lb 6.4 oz (115.8 kg)    Physical Exam Vitals and nursing note reviewed.  Constitutional:      General: She is not in acute distress.    Appearance: Normal appearance. She is obese. She is not ill-appearing, toxic-appearing or diaphoretic.  HENT:     Head: Normocephalic and atraumatic.      Right Ear: External ear normal.     Left Ear: External ear normal.     Nose: Nose normal.     Mouth/Throat:     Mouth: Mucous membranes are moist.     Pharynx: Oropharynx is clear.  Eyes:     General: No scleral icterus.       Right eye: No discharge.        Left eye: No discharge.     Extraocular Movements: Extraocular movements intact.     Conjunctiva/sclera: Conjunctivae normal.     Pupils: Pupils are equal, round, and reactive to light.  Cardiovascular:     Rate and Rhythm: Normal rate and regular rhythm.     Pulses: Normal pulses.     Heart sounds: Normal heart sounds. No murmur heard.    No friction rub. No gallop.  Pulmonary:     Effort: Pulmonary effort is normal. No respiratory distress.     Breath sounds: Normal breath sounds. No stridor. No wheezing, rhonchi or rales.  Chest:     Chest wall: No tenderness.  Musculoskeletal:        General: Normal range of motion.     Cervical back: Normal range of motion and neck supple.  Skin:    General: Skin is warm and dry.     Capillary Refill: Capillary refill takes less than 2 seconds.     Coloration:  Skin is not jaundiced or pale.     Findings: No bruising, erythema, lesion or rash.  Neurological:     General: No focal deficit present.     Mental Status: She is alert and oriented to person, place, and time. Mental status is at baseline.  Psychiatric:        Mood and Affect: Mood normal.        Behavior: Behavior normal.        Thought Content: Thought content normal.        Judgment: Judgment normal.     Results for orders placed or performed in visit on 12/17/23  Bayer DCA Hb A1c Waived   Collection Time: 12/17/23 10:35 AM  Result Value Ref Range   HB A1C (BAYER DCA - WAIVED) 7.7 (H) 4.8 - 5.6 %      Assessment & Plan:   Problem List Items Addressed This Visit       Endocrine   Uncontrolled type 2 diabetes mellitus with hyperglycemia (HCC) - Primary   Not under good control with A1c of 8.2- will increase  her mounjaro  to 15mg  and recheck in 3 months. May need to add metformin  if not controlled. Continue to monitor closely.      Relevant Medications   tirzepatide  (MOUNJARO ) 15 MG/0.5ML Pen   Other Relevant Orders   Bayer DCA Hb A1c Waived   CBC with Differential/Platelet   Comprehensive metabolic panel with GFR   Lipid Panel w/o Chol/HDL Ratio   Other Visit Diagnoses       Epigastric pain       Will check labs today- start omeprazole  about 24 hours before usually have pain and add carafate. Call if not getting better or getting worse.   Relevant Orders   Lipase   Amylase     Hepatitis B vaccination status unknown       Labs drawn today.   Relevant Orders   Hepatitis B surface antibody,quantitative        Follow up plan: Return in about 3 months (around 06/20/2024).

## 2024-03-20 NOTE — Assessment & Plan Note (Signed)
 Not under good control with A1c of 8.2- will increase her mounjaro  to 15mg  and recheck in 3 months. May need to add metformin  if not controlled. Continue to monitor closely.

## 2024-03-21 LAB — CBC WITH DIFFERENTIAL/PLATELET
Basophils Absolute: 0.1 x10E3/uL (ref 0.0–0.2)
Basos: 1 %
EOS (ABSOLUTE): 0.3 x10E3/uL (ref 0.0–0.4)
Eos: 3 %
Hematocrit: 44.7 % (ref 34.0–46.6)
Hemoglobin: 14.5 g/dL (ref 11.1–15.9)
Immature Grans (Abs): 0 x10E3/uL (ref 0.0–0.1)
Immature Granulocytes: 0 %
Lymphocytes Absolute: 4.6 x10E3/uL — ABNORMAL HIGH (ref 0.7–3.1)
Lymphs: 43 %
MCH: 30.4 pg (ref 26.6–33.0)
MCHC: 32.4 g/dL (ref 31.5–35.7)
MCV: 94 fL (ref 79–97)
Monocytes Absolute: 0.7 x10E3/uL (ref 0.1–0.9)
Monocytes: 6 %
Neutrophils Absolute: 5 x10E3/uL (ref 1.4–7.0)
Neutrophils: 47 %
Platelets: 401 x10E3/uL (ref 150–450)
RBC: 4.77 x10E6/uL (ref 3.77–5.28)
RDW: 13.1 % (ref 11.7–15.4)
WBC: 10.7 x10E3/uL (ref 3.4–10.8)

## 2024-03-21 LAB — COMPREHENSIVE METABOLIC PANEL WITH GFR
ALT: 30 IU/L (ref 0–32)
AST: 16 IU/L (ref 0–40)
Albumin: 4.5 g/dL (ref 3.9–4.9)
Alkaline Phosphatase: 59 IU/L (ref 44–121)
BUN/Creatinine Ratio: 20 (ref 9–23)
BUN: 18 mg/dL (ref 6–24)
Bilirubin Total: 0.2 mg/dL (ref 0.0–1.2)
CO2: 22 mmol/L (ref 20–29)
Calcium: 9.6 mg/dL (ref 8.7–10.2)
Chloride: 97 mmol/L (ref 96–106)
Creatinine, Ser: 0.88 mg/dL (ref 0.57–1.00)
Globulin, Total: 3.1 g/dL (ref 1.5–4.5)
Glucose: 133 mg/dL — ABNORMAL HIGH (ref 70–99)
Potassium: 4.2 mmol/L (ref 3.5–5.2)
Sodium: 137 mmol/L (ref 134–144)
Total Protein: 7.6 g/dL (ref 6.0–8.5)
eGFR: 84 mL/min/1.73 (ref >59)

## 2024-03-21 LAB — AMYLASE: Amylase: 42 U/L (ref 31–110)

## 2024-03-21 LAB — LIPID PANEL W/O CHOL/HDL RATIO
Cholesterol, Total: 160 mg/dL (ref 100–199)
HDL: 35 mg/dL — ABNORMAL LOW (ref >39)
LDL Chol Calc (NIH): 92 mg/dL (ref 0–99)
Triglycerides: 194 mg/dL — ABNORMAL HIGH (ref 0–149)
VLDL Cholesterol Cal: 33 mg/dL (ref 5–40)

## 2024-03-21 LAB — HEPATITIS B SURFACE ANTIBODY, QUANTITATIVE: Hepatitis B Surf Ab Quant: 9.2 m[IU]/mL — ABNORMAL LOW

## 2024-03-21 LAB — LIPASE: Lipase: 52 U/L (ref 14–72)

## 2024-03-23 ENCOUNTER — Ambulatory Visit: Payer: Self-pay | Admitting: Family Medicine

## 2024-03-31 ENCOUNTER — Telehealth: Payer: Self-pay

## 2024-03-31 ENCOUNTER — Other Ambulatory Visit (HOSPITAL_COMMUNITY): Payer: Self-pay

## 2024-03-31 NOTE — Telephone Encounter (Signed)
 Pharmacy Patient Advocate Encounter   Received notification from Onbase that prior authorization for Nurtec 75MG  dispersible tablets  is required/requested.   Insurance verification completed.   The patient is insured through [employer] .   Per test claim: PA required; PA submitted to above mentioned insurance via CoverMyMeds Key/confirmation #/EOC [id number] Status is pending

## 2024-03-31 NOTE — Telephone Encounter (Signed)
 Pharmacy Patient Advocate Encounter   Received notification from Onbase that prior authorization for Nurtec 75MG  dispersible tablets  is required/requested.   Insurance verification completed.   The patient is insured through [facility] .   Per test claim: PA required; PA started via CoverMyMeds. KEY [id number] . Waiting for clinical questions to populate.

## 2024-04-01 ENCOUNTER — Other Ambulatory Visit (HOSPITAL_COMMUNITY): Payer: Self-pay

## 2024-04-01 NOTE — Telephone Encounter (Signed)
 Noted

## 2024-04-01 NOTE — Telephone Encounter (Signed)
 Pharmacy Patient Advocate Encounter  Received notification from [facility] that Prior Authorization for Nurtec 75MG  dispersible tablets  has been DENIED.  See denial reason below. No denial letter attached in CMM. Will attach denial letter to Media tab once received.   PA #/Case ID/Reference #: [id number]  *insurance only approved up to 16 tablets every 30 days

## 2024-04-19 ENCOUNTER — Encounter: Payer: Self-pay | Admitting: Family Medicine

## 2024-04-21 ENCOUNTER — Telehealth: Payer: Self-pay | Admitting: Family Medicine

## 2024-04-21 NOTE — Telephone Encounter (Signed)
 Patient dropped off FMLA Form to be filled out by provider. Patient is requesting a call back at [phone] within 2-5 days when completed. Document is located in providers folder.   FYI: Patient is requesting form be Faxed to [phone]. Then call to pick up.

## 2024-05-01 NOTE — Telephone Encounter (Signed)
 signed

## 2024-05-01 NOTE — Telephone Encounter (Signed)
 Ok for E2C2 to review.  Call patient but unable to leave a message as phone kept ringing. Please advise that I also sent a mychart message.

## 2024-05-01 NOTE — Telephone Encounter (Signed)
 Forms faxed as requested. Called and notified patient of this and let her know that her copy is up front to be picked up.

## 2024-06-18 LAB — OPHTHALMOLOGY REPORT-SCANNED

## 2024-06-22 ENCOUNTER — Encounter: Payer: Self-pay | Admitting: Family Medicine

## 2024-06-22 ENCOUNTER — Ambulatory Visit: Admitting: Family Medicine

## 2024-06-22 VITALS — BP 99/66 | HR 74 | Temp 98.0°F | Ht 63.5 in | Wt 245.8 lb

## 2024-06-22 DIAGNOSIS — E1129 Type 2 diabetes mellitus with other diabetic kidney complication: Secondary | ICD-10-CM | POA: Diagnosis not present

## 2024-06-22 DIAGNOSIS — R809 Proteinuria, unspecified: Secondary | ICD-10-CM

## 2024-06-22 DIAGNOSIS — Z23 Encounter for immunization: Secondary | ICD-10-CM

## 2024-06-22 LAB — BAYER DCA HB A1C WAIVED: HB A1C (BAYER DCA - WAIVED): 6.6 % — ABNORMAL HIGH (ref 4.8–5.6)

## 2024-06-22 NOTE — Progress Notes (Signed)
 BP 99/66   Pulse 74   Temp 98 F (36.7 C) (Oral)   Ht 5' 3.5 (1.613 m)   Wt 245 lb 12.8 oz (111.5 kg)   SpO2 98%   BMI 42.86 kg/m    Subjective:    Patient ID: Mary Drake, female    DOB: Jun 04, 1981, 43 y.o.   MRN: [id number]  HPI: Mary Drake is a 43 y.o. female  Chief Complaint  Patient presents with   Diabetes   DIABETES Hypoglycemic episodes:no Polydipsia/polyuria: no Visual disturbance: no Chest pain: no Paresthesias: no Glucose Monitoring: yes  Accucheck frequency: occasionally Taking Insulin?: no Blood Pressure Monitoring: not checking Retinal Examination: Up to Date Foot Exam: Up to Date Diabetic Education: Completed Pneumovax: Up to Date Influenza: Up to Date Aspirin: no  Relevant past medical, surgical, family and social history reviewed and updated as indicated. Interim medical history since our last visit reviewed. Allergies and medications reviewed and updated.  Review of Systems  Constitutional: Negative.   Respiratory: Negative.    Cardiovascular: Negative.   Musculoskeletal: Negative.   Neurological: Negative.   Psychiatric/Behavioral: Negative.      Per HPI unless specifically indicated above     Objective:    BP 99/66   Pulse 74   Temp 98 F (36.7 C) (Oral)   Ht 5' 3.5 (1.613 m)   Wt 245 lb 12.8 oz (111.5 kg)   SpO2 98%   BMI 42.86 kg/m   Wt Readings from Last 3 Encounters:  06/22/24 245 lb 12.8 oz (111.5 kg)  03/20/24 254 lb 9.6 oz (115.5 kg)  12/17/23 257 lb 1.6 oz (116.6 kg)    Physical Exam Vitals and nursing note reviewed.  Constitutional:      General: She is not in acute distress.    Appearance: Normal appearance. She is obese. She is not ill-appearing, toxic-appearing or diaphoretic.  HENT:     Head: Normocephalic and atraumatic.     Right Ear: External ear normal.     Left Ear: External ear normal.     Nose: Nose normal.     Mouth/Throat:     Mouth: Mucous membranes are moist.      Pharynx: Oropharynx is clear.  Eyes:     General: No scleral icterus.       Right eye: No discharge.        Left eye: No discharge.     Extraocular Movements: Extraocular movements intact.     Conjunctiva/sclera: Conjunctivae normal.     Pupils: Pupils are equal, round, and reactive to light.  Cardiovascular:     Rate and Rhythm: Normal rate and regular rhythm.     Pulses: Normal pulses.     Heart sounds: Normal heart sounds. No murmur heard.    No friction rub. No gallop.  Pulmonary:     Effort: Pulmonary effort is normal. No respiratory distress.     Breath sounds: Normal breath sounds. No stridor. No wheezing, rhonchi or rales.  Chest:     Chest wall: No tenderness.  Musculoskeletal:        General: Normal range of motion.     Cervical back: Normal range of motion and neck supple.  Skin:    General: Skin is warm and dry.     Capillary Refill: Capillary refill takes less than 2 seconds.     Coloration: Skin is not jaundiced or pale.     Findings: No bruising, erythema, lesion or rash.  Neurological:  General: No focal deficit present.     Mental Status: She is alert and oriented to person, place, and time. Mental status is at baseline.  Psychiatric:        Mood and Affect: Mood normal.        Behavior: Behavior normal.        Thought Content: Thought content normal.        Judgment: Judgment normal.     Results for orders placed or performed in visit on 03/20/24  Bayer DCA Hb A1c Waived   Collection Time: 03/20/24  8:34 AM  Result Value Ref Range   HB A1C (BAYER DCA - WAIVED) 8.2 (H) 4.8 - 5.6 %  CBC with Differential/Platelet   Collection Time: 03/20/24  8:35 AM  Result Value Ref Range   WBC 10.7 3.4 - 10.8 x10E3/uL   RBC 4.77 3.77 - 5.28 x10E6/uL   Hemoglobin 14.5 11.1 - 15.9 g/dL   Hematocrit 55.2 65.9 - 46.6 %   MCV 94 79 - 97 fL   MCH 30.4 26.6 - 33.0 pg   MCHC 32.4 31.5 - 35.7 g/dL   RDW 86.8 88.2 - 84.5 %   Platelets 401 150 - 450 x10E3/uL    Neutrophils 47 Not Estab. %   Lymphs 43 Not Estab. %   Monocytes 6 Not Estab. %   Eos 3 Not Estab. %   Basos 1 Not Estab. %   Neutrophils Absolute 5.0 1.4 - 7.0 x10E3/uL   Lymphocytes Absolute 4.6 (H) 0.7 - 3.1 x10E3/uL   Monocytes Absolute 0.7 0.1 - 0.9 x10E3/uL   EOS (ABSOLUTE) 0.3 0.0 - 0.4 x10E3/uL   Basophils Absolute 0.1 0.0 - 0.2 x10E3/uL   Immature Granulocytes 0 Not Estab. %   Immature Grans (Abs) 0.0 0.0 - 0.1 x10E3/uL  Comprehensive metabolic panel with GFR   Collection Time: 03/20/24  8:35 AM  Result Value Ref Range   Glucose 133 (H) 70 - 99 mg/dL   BUN 18 6 - 24 mg/dL   Creatinine, Ser 9.11 0.57 - 1.00 mg/dL   eGFR 84 >40 fO/fpw/8.26   BUN/Creatinine Ratio 20 9 - 23   Sodium 137 134 - 144 mmol/L   Potassium 4.2 3.5 - 5.2 mmol/L   Chloride 97 96 - 106 mmol/L   CO2 22 20 - 29 mmol/L   Calcium 9.6 8.7 - 10.2 mg/dL   Total Protein 7.6 6.0 - 8.5 g/dL   Albumin 4.5 3.9 - 4.9 g/dL   Globulin, Total 3.1 1.5 - 4.5 g/dL   Bilirubin Total 0.2 0.0 - 1.2 mg/dL   Alkaline Phosphatase 59 44 - 121 IU/L   AST 16 0 - 40 IU/L   ALT 30 0 - 32 IU/L  Lipid Panel w/o Chol/HDL Ratio   Collection Time: 03/20/24  8:35 AM  Result Value Ref Range   Cholesterol, Total 160 100 - 199 mg/dL   Triglycerides 805 (H) 0 - 149 mg/dL   HDL 35 (L) >60 mg/dL   VLDL Cholesterol Cal 33 5 - 40 mg/dL   LDL Chol Calc (NIH) 92 0 - 99 mg/dL  Hepatitis B surface antibody,quantitative   Collection Time: 03/20/24  8:35 AM  Result Value Ref Range   Hepatitis B Surf Ab Quant 9.2 (L) Immunity>10 mIU/mL  Lipase   Collection Time: 03/20/24  8:35 AM  Result Value Ref Range   Lipase 52 14 - 72 U/L  Amylase   Collection Time: 03/20/24  8:35 AM  Result Value Ref Range  Amylase 42 31 - 110 U/L      Assessment & Plan:   Problem List Items Addressed This Visit       Endocrine   Type 2 diabetes mellitus with microalbuminuria (HCC) - Primary   Doing great with A1c of 6.6. Continue current regimen.  Continue to monitor. Recheck 3 months. Call with any concerns.       Relevant Orders   Bayer DCA Hb A1c Waived   Other Visit Diagnoses       Need for hepatitis B booster vaccination       Hep B given today.   Relevant Orders   Heplisav-B (HepB-CPG) Vaccine        Follow up plan: Return in about 3 months (around 09/22/2024) for physical.

## 2024-06-22 NOTE — Assessment & Plan Note (Signed)
 Doing great with A1c of 6.6. Continue current regimen. Continue to monitor. Recheck 3 months. Call with any concerns.

## 2024-09-22 ENCOUNTER — Encounter: Payer: Self-pay | Admitting: Family Medicine

## 2024-09-22 ENCOUNTER — Ambulatory Visit: Admitting: Family Medicine

## 2024-09-22 ENCOUNTER — Other Ambulatory Visit (HOSPITAL_COMMUNITY)
Admission: RE | Admit: 2024-09-22 | Discharge: 2024-09-22 | Disposition: A | Source: Ambulatory Visit | Attending: Family Medicine | Admitting: Family Medicine

## 2024-09-22 VITALS — BP 129/85 | HR 88 | Temp 97.7°F | Resp 17 | Ht 64.0 in | Wt 241.6 lb

## 2024-09-22 DIAGNOSIS — Z0000 Encounter for general adult medical examination without abnormal findings: Secondary | ICD-10-CM | POA: Insufficient documentation

## 2024-09-22 DIAGNOSIS — E1129 Type 2 diabetes mellitus with other diabetic kidney complication: Secondary | ICD-10-CM | POA: Diagnosis not present

## 2024-09-22 DIAGNOSIS — Z1231 Encounter for screening mammogram for malignant neoplasm of breast: Secondary | ICD-10-CM

## 2024-09-22 DIAGNOSIS — R809 Proteinuria, unspecified: Secondary | ICD-10-CM | POA: Diagnosis not present

## 2024-09-22 LAB — MICROALBUMIN, URINE WAIVED
Creatinine, Urine Waived: 300 mg/dL (ref 10–300)
Microalb, Ur Waived: 30 mg/L — ABNORMAL HIGH (ref 0–19)
Microalb/Creat Ratio: <30 mg/g

## 2024-09-22 LAB — BAYER DCA HB A1C WAIVED: HB A1C (BAYER DCA - WAIVED): 6.8 % — ABNORMAL HIGH (ref 4.8–5.6)

## 2024-09-22 MED ORDER — SUCRALFATE 1 G PO TABS: 1.0000 g | ORAL_TABLET | Freq: Three times a day (TID) | ORAL | 1 refills | Status: AC

## 2024-09-22 MED ORDER — AZELASTINE HCL 0.1 % NA SOLN: 2.0000 | Freq: Two times a day (BID) | NASAL | 12 refills | Status: AC

## 2024-09-22 MED ORDER — MONTELUKAST SODIUM 10 MG PO TABS: 10.0000 mg | ORAL_TABLET | Freq: Every day | ORAL | 1 refills | Status: AC

## 2024-09-22 MED ORDER — TIRZEPATIDE 15 MG/0.5ML ~~LOC~~ SOAJ: 15.0000 mg | SUBCUTANEOUS | 1 refills | Status: AC

## 2024-09-22 MED ORDER — OMEPRAZOLE 20 MG PO CPDR: 20.0000 mg | DELAYED_RELEASE_CAPSULE | Freq: Every day | ORAL | 3 refills | Status: AC

## 2024-09-22 MED ORDER — MELOXICAM 15 MG PO TABS: 15.0000 mg | ORAL_TABLET | Freq: Every day | ORAL | 1 refills | Status: AC

## 2024-09-22 NOTE — Assessment & Plan Note (Signed)
 Tolerating her medicine well. Rechecking labs today. A1c 6.8- continue current regimen. Continue to monitor. Call with any concerns.

## 2024-09-22 NOTE — Progress Notes (Signed)
 "  BP 129/85 (BP Location: Left Arm, Patient Position: Sitting, Cuff Size: Large)   Pulse 88   Temp 97.7 F (36.5 C) (Oral)   Resp 17   Ht 5' 4 (1.626 m)   Wt 241 lb 9.6 oz (109.6 kg)   SpO2 96%   BMI 41.47 kg/m    Subjective:    Patient ID: Mary Drake, female    DOB: 1980-09-07, 44 y.o.   MRN: [id number]  HPI: Mary Drake is a 44 y.o. female presenting on 09/22/2024 for comprehensive medical examination. Current medical complaints include:  DIABETES- still having some abdominal pain for about a day when she does her mounjaro  shot but it's improving Hypoglycemic episodes:no Polydipsia/polyuria: no Visual disturbance: no Chest pain: no Paresthesias: no Glucose Monitoring: yes  Accucheck frequency: occasionally Taking Insulin?: no Blood Pressure Monitoring: not checking Retinal Examination: Up to Date Foot Exam: Up to Date Diabetic Education: Completed Pneumovax: Not up to Date Influenza: Up to Date Aspirin: no  Menopausal Symptoms: no  Depression Screen done today and results listed below:     09/22/2024   10:04 AM 03/20/2024    8:27 AM 12/17/2023   10:31 AM 11/26/2023    2:34 PM 09/12/2023   11:25 AM  Depression screen PHQ 2/9  Decreased Interest 0 0 0 0 1  Down, Depressed, Hopeless 0 0 0 0 0  PHQ - 2 Score 0 0 0 0 1  Altered sleeping 0 1 0 0 0  Tired, decreased energy 1 1 1 1 1   Change in appetite 1 0 1 0 0  Feeling bad or failure about yourself  0 1 0 0 0  Trouble concentrating 0 0 0 0 0  Moving slowly or fidgety/restless 0 0 0 0 0  Suicidal thoughts 0 0 0 0 0  PHQ-9 Score 2 3  2  1  2    Difficult doing work/chores Not difficult at all Not difficult at all Not difficult at all Not difficult at all Somewhat difficult     Data saved with a previous flowsheet row definition     Past Medical History:  Past Medical History:  Diagnosis Date   Migraines    Psoriasis     Surgical History:  Past Surgical History:  Procedure Laterality Date    CHOLECYSTECTOMY  2010    Medications:  Current Outpatient Medications on File Prior to Visit  Medication Sig   cetirizine (ZYRTEC) 10 MG tablet Take 10 mg by mouth daily.   nystatin  (MYCOSTATIN /NYSTOP ) powder Apply 1 Application topically 3 (three) times daily.   Rimegepant Sulfate (NURTEC) 75 MG TBDP Take 1 tablet (75 mg total) by mouth daily as needed (migraine).   TALTZ 80 MG/ML SOAJ    No current facility-administered medications on file prior to visit.    Allergies:  Allergies[1]  Social History:  Social History   Socioeconomic History   Marital status: Single    Spouse name: Not on file   Number of children: 0   Years of education: Not on file   Highest education level: Not on file  Occupational History   Not on file  Tobacco Use   Smoking status: Never   Smokeless tobacco: Never  Vaping Use   Vaping status: Never Used  Substance and Sexual Activity   Alcohol use: Yes    Comment: 0n occasion   Drug use: Never   Sexual activity: Yes    Birth control/protection: None  Other Topics Concern   Not on  file  Social History Narrative   Not on file   Social Drivers of Health   Tobacco Use: Low Risk (09/22/2024)   Patient History    Smoking Tobacco Use: Never    Smokeless Tobacco Use: Never    Passive Exposure: Not on file  Financial Resource Strain: Not on file  Food Insecurity: Not on file  Transportation Needs: Not on file  Physical Activity: Not on file  Stress: Not on file  Social Connections: Not on file  Intimate Partner Violence: Not on file  Depression (PHQ2-9): Low Risk (09/22/2024)   Depression (PHQ2-9)    PHQ-2 Score: 2  Alcohol Screen: Not on file  Housing: Not on file  Utilities: Not on file  Health Literacy: Not on file   Tobacco Use History[2] Social History   Substance and Sexual Activity  Alcohol Use Yes   Comment: 0n occasion    Family History:  Family History  Problem Relation Age of Onset   Diabetes Mother    Hypertension  Mother    Diabetes Father    Heart disease Father    Heart failure Father     Past medical history, surgical history, medications, allergies, family history and social history reviewed with patient today and changes made to appropriate areas of the chart.   Review of Systems  Constitutional: Negative.   HENT: Negative.    Eyes: Negative.   Respiratory: Negative.    Cardiovascular:  Positive for leg swelling (R leg around the knee). Negative for chest pain, palpitations, orthopnea, claudication and PND.  Gastrointestinal:  Positive for abdominal pain and heartburn. Negative for blood in stool, constipation, diarrhea, melena, nausea and vomiting.  Genitourinary: Negative.   Musculoskeletal: Negative.   Skin: Negative.   Neurological: Negative.   Endo/Heme/Allergies:  Positive for environmental allergies. Negative for polydipsia. Does not bruise/bleed easily.  Psychiatric/Behavioral: Negative.     All other ROS negative except what is listed above and in the HPI.      Objective:    BP 129/85 (BP Location: Left Arm, Patient Position: Sitting, Cuff Size: Large)   Pulse 88   Temp 97.7 F (36.5 C) (Oral)   Resp 17   Ht 5' 4 (1.626 m)   Wt 241 lb 9.6 oz (109.6 kg)   SpO2 96%   BMI 41.47 kg/m   Wt Readings from Last 3 Encounters:  09/22/24 241 lb 9.6 oz (109.6 kg)  06/22/24 245 lb 12.8 oz (111.5 kg)  03/20/24 254 lb 9.6 oz (115.5 kg)    Physical Exam Vitals and nursing note reviewed. Exam conducted with a chaperone present.  Constitutional:      General: She is not in acute distress.    Appearance: Normal appearance. She is obese. She is not ill-appearing, toxic-appearing or diaphoretic.  HENT:     Head: Normocephalic and atraumatic.     Right Ear: Tympanic membrane, ear canal and external ear normal. There is no impacted cerumen.     Left Ear: Tympanic membrane, ear canal and external ear normal. There is no impacted cerumen.     Nose: Nose normal. No congestion or  rhinorrhea.     Mouth/Throat:     Mouth: Mucous membranes are moist.     Pharynx: Oropharynx is clear. No oropharyngeal exudate or posterior oropharyngeal erythema.  Eyes:     General: No scleral icterus.       Right eye: No discharge.        Left eye: No discharge.  Extraocular Movements: Extraocular movements intact.     Conjunctiva/sclera: Conjunctivae normal.     Pupils: Pupils are equal, round, and reactive to light.  Neck:     Vascular: No carotid bruit.  Cardiovascular:     Rate and Rhythm: Normal rate and regular rhythm.     Pulses: Normal pulses.     Heart sounds: No murmur heard.    No friction rub. No gallop.  Pulmonary:     Effort: Pulmonary effort is normal. No respiratory distress.     Breath sounds: Normal breath sounds. No stridor. No wheezing, rhonchi or rales.  Chest:     Chest wall: No tenderness.  Breasts:    Right: Normal.     Left: Normal.  Abdominal:     General: Abdomen is flat. Bowel sounds are normal. There is no distension.     Palpations: Abdomen is soft. There is no mass.     Tenderness: There is no abdominal tenderness. There is no right CVA tenderness, left CVA tenderness, guarding or rebound.     Hernia: No hernia is present.  Genitourinary:    Labia:        Right: No rash, tenderness, lesion or injury.        Left: No rash, tenderness, lesion or injury.      Cervix: Normal.     Uterus: Normal.   Musculoskeletal:        General: No swelling, tenderness, deformity or signs of injury.     Cervical back: Normal range of motion and neck supple. No rigidity. No muscular tenderness.     Right lower leg: No edema.     Left lower leg: No edema.  Lymphadenopathy:     Cervical: No cervical adenopathy.  Skin:    General: Skin is warm and dry.     Capillary Refill: Capillary refill takes less than 2 seconds.     Coloration: Skin is not jaundiced or pale.     Findings: No bruising, erythema, lesion or rash.  Neurological:     General: No focal  deficit present.     Mental Status: She is alert and oriented to person, place, and time. Mental status is at baseline.     Cranial Nerves: No cranial nerve deficit.     Sensory: No sensory deficit.     Motor: No weakness.     Coordination: Coordination normal.     Gait: Gait normal.     Deep Tendon Reflexes: Reflexes normal.  Psychiatric:        Mood and Affect: Mood normal.        Behavior: Behavior normal.        Thought Content: Thought content normal.        Judgment: Judgment normal.     Results for orders placed or performed in visit on 09/22/24  Bayer DCA Hb A1c Waived   Collection Time: 09/22/24 10:14 AM  Result Value Ref Range   HB A1C (BAYER DCA - WAIVED) 6.8 (H) 4.8 - 5.6 %  Microalbumin, Urine Waived   Collection Time: 09/22/24 10:14 AM  Result Value Ref Range   Microalb, Ur Waived 30 (H) 0 - 19 mg/L   Creatinine, Urine Waived 300 10 - 300 mg/dL   Microalb/Creat Ratio <30 <30 mg/g      Assessment & Plan:   Problem List Items Addressed This Visit       Endocrine   Type 2 diabetes mellitus with microalbuminuria (HCC)   Tolerating her medicine well.  Rechecking labs today. A1c 6.8- continue current regimen. Continue to monitor. Call with any concerns.       Relevant Medications   tirzepatide  (MOUNJARO ) 15 MG/0.5ML Pen   Other Relevant Orders   Bayer DCA Hb A1c Waived (Completed)   Microalbumin, Urine Waived (Completed)   Other Visit Diagnoses       Routine general medical examination at a health care facility    -  Primary   Vaccines up to date. Screening labs checked today. Pap done. Mammogram ordered. Continue diet and exercise. Call with any concerns.   Relevant Orders   CBC with Differential/Platelet   Comprehensive metabolic panel with GFR   Lipid Panel w/o Chol/HDL Ratio   TSH   Cytology - PAP     Encounter for screening mammogram for malignant neoplasm of breast       Mammogram ordered today.   Relevant Orders   MM 3D SCREENING MAMMOGRAM  BILATERAL BREAST        Follow up plan: Return in about 3 months (around 12/21/2024).   LABORATORY TESTING:  - Pap smear: pap done  IMMUNIZATIONS:   - Tdap: Tetanus vaccination status reviewed: last tetanus booster within 10 years. - Influenza: Up to date - Pneumovax: Up to date - Prevnar: Refused - COVID: Refused - HPV: Not applicable - Shingrix vaccine: Not applicable  SCREENING: -Mammogram: Ordered today   PATIENT COUNSELING:   Advised to take 1 mg of folate supplement per day if capable of pregnancy.   Sexuality: Discussed sexually transmitted diseases, partner selection, use of condoms, avoidance of unintended pregnancy  and contraceptive alternatives.   Advised to avoid cigarette smoking.  I discussed with the patient that most people either abstain from alcohol or drink within safe limits (<=14/week and <=4 drinks/occasion for males, <=7/weeks and <= 3 drinks/occasion for females) and that the risk for alcohol disorders and other health effects rises proportionally with the number of drinks per week and how often a drinker exceeds daily limits.  Discussed cessation/primary prevention of drug use and availability of treatment for abuse.   Diet: Encouraged to adjust caloric intake to maintain  or achieve ideal body weight, to reduce intake of dietary saturated fat and total fat, to limit sodium intake by avoiding high sodium foods and not adding table salt, and to maintain adequate dietary potassium and calcium preferably from fresh fruits, vegetables, and low-fat dairy products.    stressed the importance of regular exercise  Injury prevention: Discussed safety belts, safety helmets, smoke detector, smoking near bedding or upholstery.   Dental health: Discussed importance of regular tooth brushing, flossing, and dental visits.    NEXT PREVENTATIVE PHYSICAL DUE IN 1 YEAR. Return in about 3 months (around 12/21/2024).               [1]   Allergies Allergen Reactions   Codeine   [2]  Social History Tobacco Use  Smoking Status Never  Smokeless Tobacco Never   "

## 2024-09-23 LAB — LIPID PANEL W/O CHOL/HDL RATIO
Cholesterol, Total: 178 mg/dL (ref 100–199)
HDL: 37 mg/dL — ABNORMAL LOW
LDL Chol Calc (NIH): 116 mg/dL — ABNORMAL HIGH (ref 0–99)
Triglycerides: 142 mg/dL (ref 0–149)
VLDL Cholesterol Cal: 25 mg/dL (ref 5–40)

## 2024-09-23 LAB — TSH: TSH: 3.05 u[IU]/mL (ref 0.450–4.500)

## 2024-09-23 LAB — CBC WITH DIFFERENTIAL/PLATELET
Basophils Absolute: 0.1 10*3/uL (ref 0.0–0.2)
Basos: 1 %
EOS (ABSOLUTE): 0.4 10*3/uL (ref 0.0–0.4)
Eos: 4 %
Hematocrit: 43.6 % (ref 34.0–46.6)
Hemoglobin: 14.7 g/dL (ref 11.1–15.9)
Immature Grans (Abs): 0 10*3/uL (ref 0.0–0.1)
Immature Granulocytes: 0 %
Lymphocytes Absolute: 3.5 10*3/uL — ABNORMAL HIGH (ref 0.7–3.1)
Lymphs: 32 %
MCH: 30.4 pg (ref 26.6–33.0)
MCHC: 33.7 g/dL (ref 31.5–35.7)
MCV: 90 fL (ref 79–97)
Monocytes Absolute: 0.8 10*3/uL (ref 0.1–0.9)
Monocytes: 7 %
Neutrophils Absolute: 6.3 10*3/uL (ref 1.4–7.0)
Neutrophils: 56 %
Platelets: 430 10*3/uL (ref 150–450)
RBC: 4.84 x10E6/uL (ref 3.77–5.28)
RDW: 13 % (ref 11.7–15.4)
WBC: 11.2 10*3/uL — ABNORMAL HIGH (ref 3.4–10.8)

## 2024-09-23 LAB — COMPREHENSIVE METABOLIC PANEL WITH GFR
ALT: 19 [IU]/L (ref 0–32)
AST: 13 [IU]/L (ref 0–40)
Albumin: 4.4 g/dL (ref 3.9–4.9)
Alkaline Phosphatase: 53 [IU]/L (ref 41–116)
BUN/Creatinine Ratio: 14 (ref 9–23)
BUN: 10 mg/dL (ref 6–24)
Bilirubin Total: 0.3 mg/dL (ref 0.0–1.2)
CO2: 22 mmol/L (ref 20–29)
Calcium: 9.1 mg/dL (ref 8.7–10.2)
Chloride: 102 mmol/L (ref 96–106)
Creatinine, Ser: 0.72 mg/dL (ref 0.57–1.00)
Globulin, Total: 2.8 g/dL (ref 1.5–4.5)
Glucose: 125 mg/dL — ABNORMAL HIGH (ref 70–99)
Potassium: 4.1 mmol/L (ref 3.5–5.2)
Sodium: 137 mmol/L (ref 134–144)
Total Protein: 7.2 g/dL (ref 6.0–8.5)
eGFR: 106 mL/min/{1.73_m2}

## 2024-09-24 LAB — CYTOLOGY - PAP
Adequacy: ABSENT
Comment: NEGATIVE
Diagnosis: NEGATIVE
High risk HPV: NEGATIVE

## 2024-09-25 ENCOUNTER — Ambulatory Visit: Payer: Self-pay | Admitting: Family Medicine

## 2024-10-15 ENCOUNTER — Encounter

## 2024-12-22 ENCOUNTER — Ambulatory Visit: Admitting: Family Medicine
